# Patient Record
Sex: Female | Born: 1985 | Race: Asian | Hispanic: No | Marital: Single | Smoking: Never smoker
Health system: Southern US, Community
[De-identification: ages and names within clinical notes are randomized; demographics above are authoritative.]

## PROBLEM LIST (undated history)

## (undated) ENCOUNTER — Inpatient Hospital Stay (HOSPITAL_COMMUNITY): Payer: Self-pay

## (undated) ENCOUNTER — Ambulatory Visit: Payer: BLUE CROSS/BLUE SHIELD | Source: Home / Self Care

## (undated) DIAGNOSIS — Z789 Other specified health status: Secondary | ICD-10-CM

## (undated) HISTORY — PX: NO PAST SURGERIES: SHX2092

---
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
## 2005-11-18 ENCOUNTER — Inpatient Hospital Stay (HOSPITAL_COMMUNITY): Admission: AD | Admit: 2005-11-18 | Discharge: 2005-11-18 | Payer: Self-pay | Admitting: Obstetrics and Gynecology

## 2005-12-29 ENCOUNTER — Ambulatory Visit (HOSPITAL_COMMUNITY): Admission: RE | Admit: 2005-12-29 | Discharge: 2005-12-29 | Payer: Self-pay | Admitting: Family Medicine

## 2006-01-29 ENCOUNTER — Ambulatory Visit: Payer: Self-pay | Admitting: Family Medicine

## 2006-02-26 ENCOUNTER — Ambulatory Visit: Payer: Self-pay | Admitting: Family Medicine

## 2006-03-19 ENCOUNTER — Ambulatory Visit: Payer: Self-pay | Admitting: Family Medicine

## 2006-04-02 ENCOUNTER — Ambulatory Visit: Payer: Self-pay | Admitting: Family Medicine

## 2006-04-16 ENCOUNTER — Ambulatory Visit: Payer: Self-pay | Admitting: Obstetrics & Gynecology

## 2006-04-16 ENCOUNTER — Ambulatory Visit (HOSPITAL_COMMUNITY): Admission: RE | Admit: 2006-04-16 | Discharge: 2006-04-16 | Payer: Self-pay | Admitting: Obstetrics and Gynecology

## 2006-04-23 ENCOUNTER — Ambulatory Visit: Payer: Self-pay | Admitting: Family Medicine

## 2006-04-30 ENCOUNTER — Ambulatory Visit: Payer: Self-pay | Admitting: Family Medicine

## 2006-05-03 ENCOUNTER — Inpatient Hospital Stay (HOSPITAL_COMMUNITY): Admission: AD | Admit: 2006-05-03 | Discharge: 2006-05-05 | Payer: Self-pay | Admitting: Obstetrics & Gynecology

## 2006-05-03 ENCOUNTER — Ambulatory Visit: Payer: Self-pay | Admitting: *Deleted

## 2006-06-11 ENCOUNTER — Ambulatory Visit: Payer: Self-pay | Admitting: Sports Medicine

## 2006-06-15 ENCOUNTER — Ambulatory Visit: Payer: Self-pay | Admitting: Family Medicine

## 2006-06-15 LAB — CONVERTED CEMR LAB
Glucose, Urine, Semiquant: NEGATIVE
Nitrite: NEGATIVE
Urobilinogen, UA: 0.2
WBC Urine, dipstick: NEGATIVE

## 2006-07-03 ENCOUNTER — Telehealth (INDEPENDENT_AMBULATORY_CARE_PROVIDER_SITE_OTHER): Payer: Self-pay | Admitting: Family Medicine

## 2006-07-07 ENCOUNTER — Encounter (INDEPENDENT_AMBULATORY_CARE_PROVIDER_SITE_OTHER): Payer: Self-pay | Admitting: Family Medicine

## 2006-07-17 ENCOUNTER — Encounter (INDEPENDENT_AMBULATORY_CARE_PROVIDER_SITE_OTHER): Payer: Self-pay | Admitting: Family Medicine

## 2006-07-22 ENCOUNTER — Ambulatory Visit: Payer: Self-pay | Admitting: Family Medicine

## 2006-07-22 LAB — CONVERTED CEMR LAB: Beta hcg, urine, semiquantitative: NEGATIVE

## 2006-08-05 ENCOUNTER — Ambulatory Visit: Payer: Self-pay | Admitting: Sports Medicine

## 2006-10-21 ENCOUNTER — Ambulatory Visit: Payer: Self-pay | Admitting: Family Medicine

## 2006-12-23 ENCOUNTER — Ambulatory Visit: Payer: Self-pay | Admitting: Family Medicine

## 2007-01-07 ENCOUNTER — Ambulatory Visit: Payer: Self-pay | Admitting: Family Medicine

## 2007-02-08 ENCOUNTER — Encounter: Payer: Self-pay | Admitting: Family Medicine

## 2007-02-08 ENCOUNTER — Ambulatory Visit: Payer: Self-pay | Admitting: Sports Medicine

## 2007-02-08 ENCOUNTER — Telehealth (INDEPENDENT_AMBULATORY_CARE_PROVIDER_SITE_OTHER): Payer: Self-pay | Admitting: *Deleted

## 2007-02-08 LAB — CONVERTED CEMR LAB
Hemoglobin: 12.8 g/dL (ref 12.0–15.0)
MCHC: 33.3 g/dL (ref 30.0–36.0)
RBC: 4.84 M/uL (ref 3.87–5.11)
RDW: 13.9 % (ref 11.5–14.0)
WBC: 7.7 10*3/uL (ref 4.0–10.5)

## 2007-03-31 ENCOUNTER — Ambulatory Visit: Payer: Self-pay | Admitting: Family Medicine

## 2007-06-16 ENCOUNTER — Ambulatory Visit: Payer: Self-pay | Admitting: Family Medicine

## 2007-10-25 ENCOUNTER — Ambulatory Visit (HOSPITAL_COMMUNITY): Admission: RE | Admit: 2007-10-25 | Discharge: 2007-10-25 | Payer: Self-pay | Admitting: Neurology

## 2008-02-03 ENCOUNTER — Encounter (INDEPENDENT_AMBULATORY_CARE_PROVIDER_SITE_OTHER): Payer: Self-pay | Admitting: Family Medicine

## 2008-02-03 ENCOUNTER — Ambulatory Visit: Payer: Self-pay | Admitting: Family Medicine

## 2008-02-03 LAB — CONVERTED CEMR LAB
Bilirubin Urine: NEGATIVE
Blood in Urine, dipstick: NEGATIVE
Ketones, urine, test strip: NEGATIVE
Protein, U semiquant: NEGATIVE
Specific Gravity, Urine: 1.025
Urobilinogen, UA: 0.2
Whiff Test: NEGATIVE
pH: 6

## 2008-02-04 ENCOUNTER — Telehealth: Payer: Self-pay | Admitting: *Deleted

## 2008-02-15 ENCOUNTER — Encounter: Admission: RE | Admit: 2008-02-15 | Discharge: 2008-02-15 | Payer: Self-pay | Admitting: Internal Medicine

## 2009-04-03 ENCOUNTER — Emergency Department (HOSPITAL_COMMUNITY): Admission: EM | Admit: 2009-04-03 | Discharge: 2009-04-03 | Payer: Self-pay | Admitting: Emergency Medicine

## 2009-09-18 ENCOUNTER — Ambulatory Visit: Payer: Self-pay | Admitting: Family Medicine

## 2009-09-18 ENCOUNTER — Encounter: Payer: Self-pay | Admitting: Family Medicine

## 2009-09-18 LAB — CONVERTED CEMR LAB: Beta hcg, urine, semiquantitative: POSITIVE

## 2009-11-19 ENCOUNTER — Ambulatory Visit: Payer: Self-pay | Admitting: Family Medicine

## 2009-11-19 ENCOUNTER — Encounter: Payer: Self-pay | Admitting: Family Medicine

## 2009-11-19 LAB — CONVERTED CEMR LAB
Antibody Screen: NEGATIVE
Basophils Absolute: 0.1 10*3/uL (ref 0.0–0.1)
HCT: 33.1 % — ABNORMAL LOW (ref 36.0–46.0)
Hepatitis B Surface Ag: NEGATIVE
MCV: 81.1 fL (ref 78.0–100.0)
Monocytes Absolute: 0.6 10*3/uL (ref 0.1–1.0)

## 2009-11-26 ENCOUNTER — Ambulatory Visit: Payer: Self-pay | Admitting: Family Medicine

## 2009-11-26 ENCOUNTER — Other Ambulatory Visit: Admission: RE | Admit: 2009-11-26 | Discharge: 2009-11-26 | Payer: Self-pay | Admitting: *Deleted

## 2009-11-26 ENCOUNTER — Encounter: Payer: Self-pay | Admitting: Family Medicine

## 2009-11-26 LAB — CONVERTED CEMR LAB
Chlamydia, DNA Probe: NEGATIVE
Pap Smear: NEGATIVE

## 2009-11-26 LAB — HM PAP SMEAR

## 2009-12-10 ENCOUNTER — Encounter: Payer: Self-pay | Admitting: Family Medicine

## 2009-12-10 ENCOUNTER — Ambulatory Visit (HOSPITAL_COMMUNITY): Admission: RE | Admit: 2009-12-10 | Discharge: 2009-12-10 | Payer: Self-pay | Admitting: Family Medicine

## 2010-01-10 ENCOUNTER — Ambulatory Visit: Payer: Self-pay | Admitting: Family Medicine

## 2010-01-30 ENCOUNTER — Ambulatory Visit: Payer: Self-pay | Admitting: Family Medicine

## 2010-02-04 ENCOUNTER — Encounter: Payer: Self-pay | Admitting: Family Medicine

## 2010-02-04 ENCOUNTER — Ambulatory Visit: Payer: Self-pay | Admitting: Family Medicine

## 2010-02-04 LAB — CONVERTED CEMR LAB
Glucose, 2 hour: 96 mg/dL (ref 70–139)
Glucose, Fasting: 71 mg/dL (ref 70–99)

## 2010-02-06 ENCOUNTER — Ambulatory Visit (HOSPITAL_COMMUNITY): Admission: RE | Admit: 2010-02-06 | Discharge: 2010-02-06 | Payer: Self-pay | Admitting: Family Medicine

## 2010-02-06 ENCOUNTER — Encounter: Payer: Self-pay | Admitting: Family Medicine

## 2010-02-13 ENCOUNTER — Ambulatory Visit: Payer: Self-pay | Admitting: Family Medicine

## 2010-02-13 ENCOUNTER — Encounter: Payer: Self-pay | Admitting: Family Medicine

## 2010-02-13 LAB — CONVERTED CEMR LAB
HIV: NONREACTIVE
MCHC: 33 g/dL (ref 30.0–36.0)
MCV: 85.5 fL (ref 78.0–100.0)
Platelets: 276 10*3/uL (ref 150–400)
RBC: 4 M/uL (ref 3.87–5.11)
RDW: 13.6 % (ref 11.5–15.5)
WBC: 11.4 10*3/uL — ABNORMAL HIGH (ref 4.0–10.5)

## 2010-02-20 ENCOUNTER — Encounter: Payer: Self-pay | Admitting: Family Medicine

## 2010-03-05 ENCOUNTER — Ambulatory Visit: Payer: Self-pay | Admitting: Family Medicine

## 2010-03-19 ENCOUNTER — Ambulatory Visit: Payer: Self-pay | Admitting: Family Medicine

## 2010-04-02 ENCOUNTER — Ambulatory Visit: Payer: Self-pay | Admitting: Family Medicine

## 2010-04-17 ENCOUNTER — Ambulatory Visit: Admission: RE | Admit: 2010-04-17 | Discharge: 2010-04-17 | Payer: Self-pay | Source: Home / Self Care

## 2010-04-17 ENCOUNTER — Telehealth: Payer: Self-pay | Admitting: Family Medicine

## 2010-04-17 ENCOUNTER — Encounter: Payer: Self-pay | Admitting: Family Medicine

## 2010-04-17 ENCOUNTER — Telehealth: Payer: Self-pay | Admitting: *Deleted

## 2010-04-17 LAB — CONVERTED CEMR LAB
Chlamydia, DNA Probe: NEGATIVE
GC Probe Amp, Genital: NEGATIVE
Whiff Test: NEGATIVE

## 2010-04-18 ENCOUNTER — Encounter: Payer: Self-pay | Admitting: Family Medicine

## 2010-04-21 ENCOUNTER — Inpatient Hospital Stay (HOSPITAL_COMMUNITY)
Admission: AD | Admit: 2010-04-21 | Discharge: 2010-04-21 | Payer: Self-pay | Source: Home / Self Care | Attending: Obstetrics and Gynecology | Admitting: Obstetrics and Gynecology

## 2010-04-22 ENCOUNTER — Encounter: Payer: Self-pay | Admitting: Family Medicine

## 2010-04-29 ENCOUNTER — Ambulatory Visit: Admission: RE | Admit: 2010-04-29 | Discharge: 2010-04-29 | Payer: Self-pay | Source: Home / Self Care

## 2010-04-30 ENCOUNTER — Inpatient Hospital Stay (HOSPITAL_COMMUNITY)
Admission: AD | Admit: 2010-04-30 | Discharge: 2010-05-02 | Disposition: A | Discharge status: Still In Hospital | Payer: Self-pay | Source: Home / Self Care | Attending: Obstetrics & Gynecology | Admitting: Obstetrics & Gynecology

## 2010-05-01 LAB — CBC
HCT: 33.7 % — ABNORMAL LOW (ref 36.0–46.0)
Hemoglobin: 11.2 g/dL — ABNORMAL LOW (ref 12.0–15.0)
MCH: 26.7 pg (ref 26.0–34.0)
MCHC: 33.2 g/dL (ref 30.0–36.0)
MCV: 80.4 fL (ref 78.0–100.0)
Platelets: 301 10*3/uL (ref 150–400)
RBC: 4.19 MIL/uL (ref 3.87–5.11)
RDW: 14.5 % (ref 11.5–15.5)
WBC: 14.9 10*3/uL — ABNORMAL HIGH (ref 4.0–10.5)

## 2010-05-01 LAB — RPR: RPR Ser Ql: NONREACTIVE

## 2010-05-07 ENCOUNTER — Ambulatory Visit: Admit: 2010-05-07 | Payer: Self-pay

## 2010-05-14 NOTE — Letter (Signed)
Summary: Handout Printed  Printed Handout:  - Prenatal-Flowsheet-CCC 

## 2010-05-14 NOTE — Assessment & Plan Note (Signed)
Summary: ob,df   Vital Signs:  Patient profile:   25 year old female Weight:      111.8 pounds BP sitting:   96 / 65  Vitals Entered By: Danielle Weiss CMA, (January 10, 2010 3:12 PM)  Primary Care Danielle Weiss:  Danielle Graham, MD   History of Present Illness: OB F/U 23.1 weeks.  Doing well today. See flowsheet. Has [place] at [facility] that confirmed [facility] 05/08/09 (US showed 05/13/09 but within range).  Having a boy and have not decided on name yet.   Only issue is dryness on right upper eyelid.  Very mild thinks due to allergies. Has not tried anything yet.  No fevers, chills.  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 1 By Mouth Daily  Allergies (verified): No Known Drug Allergies  Past History:  Past Medical History: Last updated: 11/26/2009 None  Family History: Last updated: 11/26/2009 No diseases in family that she knows about  Social History: Last updated: 11/26/2009 Lives with Husband and Child. Works at [employer] part time.   Risk Factors: Smoking Status: never (01/10/2010) Packs/Day: n/a (01/10/2010)  Social History: Reviewed history from 11/26/2009 and no changes required. Lives with Husband and Child. Works at [employer] part time.   Review of Systems       See flowsheet otherwise neg  Physical Exam  General:  VS noted.  Well NAD Eyes:  Right upper eyelid with scant skin dryness. Otherwise eye exam WNL BL   Impression & Recommendations:  Problem # 1:  PREGNANCY (ICD-V22.2) Assessment Unchanged Doing well. Good weight gain and uterus growth. Plan f/u 3-4 weeks for glucola and 26 week labs.  Flu shot today. Orders: Flu Vaccine 70yrs + (16109) Medicaid OB visit - [facility]  (60454)  Problem # 2:  DERMATITIS, SEBORRHEIC (ICD-690.10) Assessment: New Advised vaseline on eyelid two times a day. Will follow up at next visit.  Complete Medication List: 1)  Prenatal Vitamins 0.8 Mg Tabs (Prenatal multivit-min-fe-fa) .Marland Kitchen.. 1 by mouth daily  Patient Instructions: 1)  Thank you for seeing me today. 2)  If you have vaginal bleeding, suspect your water has broken (large gush of fluid or continuous slow trickle from vagina) or you have more than 5 contractions per hour for 1 hours straight, please go directly to the MAU. 3)  Every morning and every evening evaluate whether you think the baby is moving enough.  If not, then do kick counts as follows.  Sit in a quiet place and count each movement.  If you get to 5 baby movements in an hour you can stop.  If at 1 hour you have less than 5 movements, keep counting for another hour.  If you don't get 10 movements in these 2 hours, you should go to the MAU or call the clinic.   OB Initial Intake Information    Positive HCG by: self    Race: Oriental/Asian    Marital status: Single    Occupation: outside work    Type of work: Chief Strategy Officer    Number of children at home: 1    [facility] delivery[facility]    Newborn's physician: [facility]  FOB Information    Husband/Father of baby: Danielle Weiss  FOB occupation Works  Menstrual History    LMP (date): 08/01/2009    LMP - Character: normal    On BCP's at conception: no    Date of positive (+) home preg. test: 09/12/2009   Flowsheet View for Follow-up Visit    Estimated weeks of       gestation:     23 1/7    Weight:     111.8    Blood pressure:   96 / 65    Headache:     few    Nausea/vomiting:   No    Edema:     0    Vaginal bleeding:   no    Vaginal discharge:   no    Fetal activity:     yes    Labor symptoms:   no    Fetal position:     ??    Taking prenatal vits?   Y    Smoking:     n/a    Next visit:     4 weeks    Resident:     Danielle Weiss   Appended Document:  ob,df     Allergies: No Known Drug Allergies   Complete Medication List: 1)  Prenatal Vitamins 0.8 Mg Tabs (Prenatal multivit-min-fe-fa) .Marland Kitchen.. 1 by mouth daily  Other Orders: Influenza Vaccine NON MCR (40981)    OB Initial Intake Information    Positive HCG by: self    Race: Oriental/Asian    Marital status: Single    Occupation: outside work    Type of work: Chief Strategy Officer    Number of children at home: 1    [facility] delivery[facility]    Newborn's physician: [facility]  FOB Information    Husband/Father of baby: Danielle Weiss    FOB occupation Works  Menstrual History    LMP (date): 08/01/2009    LMP - Character: normal    On BCP's at conception: no    Date of positive (+) home preg. test: 09/12/2009    Influenza Vaccine    Vaccine Type: Fluvax Non-MCR    Site: left deltoid    Mfr: GlaxoSmithKline    Dose: 0.5 ml    Route: IM    Given by: Danielle Weiss CMA,    Exp. Date: 10/09/2010    Lot #: [id number]    VIS given: 11/06/09 version given January 10, 2010.  Flu Vaccine Consent Questions    Do you have a history of severe allergic reactions to this vaccine? no    Any prior history of allergic reactions to egg and/or gelatin? no    Do you have a sensitivity to the preservative Thimersol? no    Do you have a past history of Guillan-Barre Syndrome? no    Do you currently have an acute febrile illness? no    Have you ever had a severe reaction to latex? no    Vaccine information given and explained to patient? yes    Are you currently pregnant? yes

## 2010-05-14 NOTE — Assessment & Plan Note (Signed)
Summary: pregnancy test & see doctor,tcb   Vital Signs:  Patient profile:   25 year old female Height:      57.5 inches Weight:      96 pounds BMI:     20.49 Temp:     99.0 degrees F oral Pulse rate:   89 / minute BP sitting:   105 / 72  (left arm) Cuff size:   regular  Vitals Entered By: Tessie Fass CMA (September 18, 2009 11:19 AM) CC: pregnancy test Is Patient Diabetic? No Pain Assessment Patient in pain? no        CC:  pregnancy test.  History of Present Illness: [name] is here because she is pregnant.  She took a positive pregnancy test at home 1 week ago.  Her test here is also positive.  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  She and her partner were trying to get pregnant.  Her LMP was 08/04/09 and she is sure of this date.  This gives her and EDC of 05/11/10 and makes her 6 weeks and 3 days pregnant.  She has not experienced any morning sickness yet but her breasts have been tender.  She had ONE SPOT of blood on her underwear on Sunday and again on Monday.  Nothing heavier.  No pain.  This is her second pregnancy.  She has a 67 yo daughter named Danielle Weiss (same father as this baby) who comes here for her medical care.  No complications with her last pregnancy.  Her daughter was born at 17 weeks.  She has no complaints today.  She needs a letter stating nshe is pregnant for medicaid.  She received medicaid with her first pregnancy.   Habits & Providers  Alcohol-Tobacco-Diet     Tobacco Status: never  Physical Exam  General:  thin, alert, NAD, vitals reviewed.  Psych:  Oriented X3, memory intact for recent and remote, normally interactive, good eye contact, not anxious appearing, and not depressed appearing.     Impression & Recommendations:  Problem # 1:  PREGNANCY (ICD-V22.2) Assessment New  Gave letter for medicaid.  She will schedule appt for labwork and first visit after medicaid approved.  If for some reason not approved, we will refer her to adopt a mom.   Previous pregnancy was uncomplicated.  Not worried about current 2 spots of bleeding.  Discussed what to watch for to seek immediate attention - heavier bleeding (more than just a few spots), abdominal pain, fever.  Would like a female provider.   Orders: [facility]- Est Level  3 (16109)  Other Orders: U Preg-[facility] (60454)  Patient Instructions: 1)  Go to the medicaid office to apply for medicaid. 2)  Once you have been approved, call back to schedule your appointment with [place].  I will let them know to schedule you with a female doctor.  3)  If you have more than just a spot or two of blood, if you get a fever, or if you get severe abdominal pain, please call us or go to [facility] for evaluation.   Laboratory Results   Urine Tests  Date/Time Received: September 18, 2009 11:27 AM  Date/Time Reported: September 18, 2009 11:29 AM     Urine HCG: positive Comments: ...............test performed by......Marland KitchenBonnie A. [place], MLS (ASCP)cm

## 2010-05-14 NOTE — Assessment & Plan Note (Signed)
Summary: ob visit/eo   Vital Signs:  Patient profile:   25 year old female Height:      57.5 inches Weight:      119 pounds Pulse rate:   97 / minute BP sitting:   101 / 72  (right arm) Cuff size:   regular  Vitals Entered By: Tessie Fass CMA (February 13, 2010 2:34 PM) CC: OB visit   Primary Care Shauni Henner:  [name], MD  CC:  OB visit.  History of Present Illness: Doing well at 28 weeks.  Obtained ultrasound which showed a normal fetus at 50%.  It estimated the fetus as 1 week younger than did Ms Neela's LMP. She thinks is reliable so will continue with her LMP as the EDC.  Is happy and healthy. See flow sheet.   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 1 By Mouth Daily  Allergies (verified): No Known Drug Allergies  Past History:  Past Medical History: Last updated: 11/26/2009 None  Social History: Last updated: 11/26/2009 Lives with Husband and Child. Works at [employer] part time.   Risk Factors: Smoking Status: never (02/13/2010) Packs/Day: n/a (02/13/2010)  Review of Systems       see flowsheet  Physical Exam  General:  VS noted.  Well woman in NAD Abdomen:  Bowel sounds positive,abdomen soft and non-tender without masses, organomegaly or hernias noted. Uterus 27 cm   Impression & Recommendations:  Problem # 1:  PREGNANCY (ICD-V22.2) Doing well. Expectant management. Follow up in 2 weeks.  Red flags reviewed.  Orders: CBC-[facility] (16109) HIV-[facility] ([id number]) RPR-[facility] [phone])  Complete Medication List: 1)  Prenatal Vitamins 0.8 Mg Tabs (Prenatal multivit-min-fe-fa) .Marland Kitchen.. 1 by mouth daily   Orders Added: 1)  CBC-[facility] [85027] 2)  HIV-[facility]  [[id number]] 3)  RPR-[facility] [[id number]]     OB Initial Intake Information    Positive HCG by: self    Race: Oriental/Asian    Marital status: Single    Occupation: outside work    Type of work: Chief Strategy Officer    Number of children at home: 1    [facility] delivery[facility]    Newborn's physician: [facility]  FOB Information    Husband/Father of baby: Bih Bih    FOB occupation Works  Menstrual History    LMP (date): 08/01/2009    LMP - Character: normal    On BCP's at conception: no    Date of positive (+) home preg. test: 09/12/2009   Flowsheet View for Follow-up Visit    Estimated weeks of       gestation:     28 0/7    Weight:     119    Blood pressure:   101 / 72    Headache:     few    Nausea/vomiting:   No    Edema:     0    Vaginal bleeding:   no    Vaginal discharge:   no    Fundal height:      27    Fetal activity:     yes    Labor symptoms:   no    Taking prenatal vits?   Y    Smoking:  n/a    Next visit:     2 weeks    Resident:     Denyse Amass   Appended Document: ob visit/eo    Clinical Lists Changes  Orders: Added new Test order of Medicaid OB visit - [facility] [phone]) - Signed

## 2010-05-14 NOTE — Letter (Signed)
Summary: Handout Printed  Printed Handout:  - Prenatal-Record-[facility] 

## 2010-05-14 NOTE — Assessment & Plan Note (Signed)
Summary: OB/KH   Vital Signs:  Patient profile:   25 year old female Weight:      124 pounds Pulse rate:   93 / minute BP sitting:   115 / 74  Vitals Entered By: Jone Baseman CMA (March 05, 2010 1:35 PM)  Primary Care Provider:  [name], MD  CC:  OB visit.  History of Present Illness: Well at 30 weeks and 6 days.  No questions. See flowsheet.   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 1 By Mouth Daily  Allergies (verified): No Known Drug Allergies  Past History:  Past Medical History: Last updated: 11/26/2009 None  Family History: Last updated: 11/26/2009 No diseases in family that she knows about  Social History: Last updated: 11/26/2009 Lives with Husband and Child. Works at [employer] part time.   Risk Factors: Smoking Status: never (03/05/2010) Packs/Day: n/a (03/05/2010)  Social History: Reviewed history from 11/26/2009 and no changes required. Lives with Husband and Child. Works at [employer] part time.   Review of Systems  The patient denies anorexia, fever, weight loss, headaches, abdominal pain, and severe indigestion/heartburn.    Physical Exam  General:  VS noted  well NAD Lungs:  Normal respiratory effort, chest expands symmetrically. Lungs are clear to auscultation, no crackles or wheezes. Heart:  Normal rate and regular rhythm. S1 and S2 normal without gallop, murmur, click, rub or other extra sounds. Abdomen:  Bowel sounds positive,abdomen soft and non-tender without masses, organomegaly or hernias noted. Uterus 29 cm Extremities:  Non edemetus Bl LE   Impression & Recommendations:  Problem # 1:  PREGNANCY (ICD-V22.2) Assessment  Unchanged  Doing well at 30.6 weeks.  Plan to follow up in 2 weeks.  Plan for baby to [facility] to [facility], Breast feed, and thinking about depo vs implanon vs mirena will discuss at next visit.   Orders: Medicaid OB visit - [facility] (16109)  Complete Medication List: 1)  Prenatal Vitamins 0.8 Mg Tabs (Prenatal multivit-min-fe-fa) .Marland Kitchen.. 1 by mouth daily  Patient Instructions: 1)  Thank you for seeing me today. 2)  If you have vaginal bleeding, suspect your water has broken (large gush of fluid or continuous slow trickle from vagina) or you have more than 5 contractions per hour for 2 hours straight, please go directly to the MAU. 3)  Every morning and every evening evaluate whether you think the baby is moving enough.  If not, then do kick counts as follows.  Sit in a quiet place and count each movement.  If you get to 5 baby movements in an hour you can stop.  If at 1 hour you have less than 5 movements, keep counting for another hour.  If you don't get 10 movements in these 2 hours, you should go to the MAU or call the clinic. 4)  Please schedule a follow-up appointment in 2 weeks.    Orders Added: 1)  Medicaid OB visit - [facility] [[id number]]     Flowsheet View for Follow-up Visit    Estimated weeks of       gestation:     30 6/7    Weight:     124    Blood pressure:   115 /  74    Headache:     few    Nausea/vomiting:   No    Edema:     0    Vaginal bleeding:   no    Vaginal discharge:   no    Fundal height:      29    FHR:       145    Fetal activity:     yes    Labor symptoms:   no    Fetal position:     vertex    Taking prenatal vits?   Y    Smoking:     n/a    Next visit:     2 weeks    Resident:     Denyse Amass

## 2010-05-14 NOTE — Letter (Signed)
Summary: Generic Letter  [facility]  [address]   [place], [place] [address]   Phone: [phone]  Fax: [phone]    09/18/2009  [facility] [address] [place], [place]  [address]  To Whom it May Concern,  Ms. Loise was seen in our office on June 7th, 2011.  She has a positive pregnancy test.  Her LMP was April 23rd, 20ll.  Her approx due date is January 28th, 2012.  This makes her 6 weeks and 3 days pregnant. Please call our office if you have any questions.          Sincerely,   Ardeen Garland  MD

## 2010-05-14 NOTE — Assessment & Plan Note (Signed)
Summary: ob visit/eo   Vital Signs:  Patient profile:   25 year old female Height:      57.5 inches Weight:      116.6 pounds BMI:     24.88 Temp:     98.2 degrees F oral Pulse rate:   86 / minute BP sitting:   100 / 68  (left arm) Cuff size:   regular  Vitals Entered By: Jimmy Footman, CMA (January 30, 2010 9:42 AM) CC: OB  26    0/7 Is Patient Diabetic? No   Primary Care Provider:  [name], MD  CC:  OB  26    0/7.  History of Present Illness: Doing well at 26 weeks.  No subjective issues. Feels baby move.  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 1 By Mouth Daily  Allergies (verified): No Known Drug Allergies  Past History:  Past Medical History: Last updated: 11/26/2009 None  Family History: Last updated: 11/26/2009 No diseases in family that she knows about  Social History: Last updated: 11/26/2009 Lives with Husband and Child. Works at [employer] part time.   Risk Factors: Smoking Status: never (01/30/2010) Packs/Day: n/a (01/30/2010)  Review of Systems  The patient denies anorexia, fever, and weight loss.    Physical Exam  General:  VS noted.  Well NAD Abdomen:  Bowel sounds positive,abdomen soft and non-tender without masses, organomegaly or hernias noted. Uterus 21 cm   Impression & Recommendations:  Problem # 1:  PREGNANCY (ICD-V22.2) Size < dates.  21 cm at 26 weeks.  Last baby 6lbs at birth. Mom and Dad both short. Plan eval for IUGR via Ob ultrasound at [facility].  Follow up in 2 weeks.  Otherwise doing well.  Orders: Ultrasound (Ultrasound) Glucose 1 hr-[facility] (82950) Medicaid OB visit - [facility] (16109)  Complete Medication List: 1)  Prenatal Vitamins  0.8 Mg Tabs (Prenatal multivit-min-fe-fa) .Marland Kitchen.. 1 by mouth daily  Patient Instructions: 1)  Thank you for seeing me today. 2)  Please schedule a follow-up appointment in 2 weeks.  3)  Go for your ultrasound as discussed.    Orders Added: 1)  Ultrasound [Ultrasound] 2)  Glucose 1 hr-[facility] [82950] 3)  Medicaid OB visit - [facility] [[id number]]     Flowsheet View for Follow-up Visit    Estimated weeks of       gestation:     26 0/7    Weight:     116.6    Blood pressure:   100 / 68    Headache:     few    Nausea/vomiting:   No    Edema:     0    Vaginal bleeding:   no    Vaginal discharge:   no    Fundal height:      21    Fetal activity:     yes    Labor symptoms:   no    Fetal position:     ??    Taking prenatal vits?   Y    Smoking:     n/a    Next visit:     2 weeks    Resident:     Denyse Amass  OB Initial Intake Information  Positive HCG by: self    Race: Oriental/Asian    Marital status: Single    Occupation: outside work    Type of work: Chief Strategy Officer    Number of children at home: 1    [facility]    Newborn's physician: [facility]  FOB Information    Husband/Father of baby: Bih Bih    FOB occupation Works  Menstrual History    LMP (date): 08/01/2009    LMP - Character: normal    On BCP's at conception: no    Date of positive (+) home preg. test: 09/12/2009

## 2010-05-14 NOTE — Assessment & Plan Note (Signed)
Summary: NOB/DSL   Vital Signs:  Patient profile:   25 year old female LMP:     08/01/2009 Weight:      103 pounds BP sitting:   105 / 70  Vitals Entered By: Tessie Fass CMA (November 26, 2009 8:49 AM) CC: NEW OB LMP (date): 08/01/2009 EDC by LMP==> 05/08/2010 EDC 05/08/2010 LMP - Character: normal LMP - Reliable? Yes     On BCP's at conception: no Date of + home preg. test: 09/12/2009 Enter LMP: 08/01/2009   Primary Care Timiko Offutt:  . [facility]  CC:  NEW OB.  History of Present Illness: Ms Klover presents to clinic today for a New Ob visit at 16weeks and 5 days. She is feeling well. Please see flow sheet.  When she was diagnosed with pregnancy in June she had requested a female Farrel Guimond. She will be OK if a man delivers her baby but she would perfer a female if possible.   Feels well no issues.  Habits & Providers  Alcohol-Tobacco-Diet     Alcohol drinks/day: 0     Tobacco Status: never     Cigarette Packs/Day: n/a  Current Problems (verified): 1)  Pregnancy  (ICD-V22.2)  Current Medications (verified): 1)  Prenatal Vitamins 0.8 Mg Tabs (Prenatal Multivit-Min-Fe-Fa) .Marland Kitchen.. 1 By Mouth Daily  Allergies (verified): No Known Drug Allergies  Past History:  Family History: Last updated: 11/26/2009 No diseases in family that she knows about  Social History: Last updated: 11/26/2009 Lives with Husband and Child. Works at [employer] part time.   Risk Factors: Smoking Status: never (11/26/2009) Packs/Day: n/a (11/26/2009)  Past Medical History: None  Family History: No diseases in family that she knows about  Social History: Lives with Husband and Child. Works at [employer] part time.  Occupation:  [employer] Hepatitis Risk:  no Packs/Day:  n/a  Review of Systems       See HPI and flowsheet  Physical Exam  General:  VS noted. Well woman in NAD Rectal:  No external abnormalities noted.  Genitalia:  Normal introitus for age, no external lesions,  no vaginal discharge, mucosa pink and moist, no vaginal or cervical lesions, no vaginal atrophy, no friaility or hemorrhage, normal uterus size  for dates (16 cm) and position, no adnexal masses or tenderness   Impression & Recommendations:  Problem # 1:  PREGNANCY (ICD-V22.2) Well no issues. Requests female Percy Winterrowd. Will refer to [facility].  Discusses Quad Screen Patient declined.  Ultrasound at end of Aug for anat scan.  Follow up with Dr. Ellin Mayhew in 1 month.   Orders: Ultrasound (Ultrasound) GC/Chlamydia-[facility] (87591/87491) Pap Smear-[facility] ([id number]) Medicaid OB visit - [facility] ([id number])  Complete Medication List: 1)  Prenatal Vitamins 0.8 Mg Tabs (Prenatal multivit-min-fe-fa) .Marland Kitchen.. 1 by mouth daily  Patient Instructions: 1)  Thank you for seeing me today. 2)  Please schedule an appointment with Dr. Basilia Jumbo in [facility] September (10th - 20th). 3)  Molly Maduro is scheduling your ultrasound. 4)  If you have vaginal bleeding, suspect your water has broken (large gush of fluid or continuous slow trickle from vagina) or you have more than 5 contractions per hour for 1 hours straight, please go directly to the MAU. 5)  Please schedule a follow-up appointment in 1 month.  Prescriptions: PRENATAL VITAMINS 0.8 MG TABS (PRENATAL MULTIVIT-MIN-FE-FA) 1 by mouth daily  #30 x 12   Entered and Authorized by:   [name] MD   Signed by:   [name] MD on 11/26/2009  Method used:   Print then Give to Patient   RxID:   [id number]    OB Initial Intake Information    Positive HCG by: self    Race: Oriental/Asian    Marital status: Single    Occupation: outside work    Type of work: Chief Strategy Officer    Number of children at home: 1    [facility]    Newborn's physician: [facility]  FOB Information    Husband/Father of baby: Bih Bih    FOB occupation Works  Menstrual History    LMP (date): 08/01/2009    EDC by LMP: 05/08/2010    Best Working EDC: 05/08/2010    LMP -  Character: normal    LMP - Reliable? : Yes    On BCP's at conception: no    Date of positive (+) home preg. test: 09/12/2009    Pre Pregnancy Weight: 96 lbs.    Symptoms since LMP: amenorrhea, fatigue, irritability   Flowsheet View for Follow-up Visit    Estimated weeks of       gestation:     16 5/7    Weight:     103    Blood pressure:   105 / 70    Headache:     few    Nausea/vomiting:   No    Edema:     0    Vaginal bleeding:   no    Vaginal discharge:   no    Fundal height:      16    FHR:       154    Fetal activity:     yes    Labor symptoms:   no    Fetal position:     ??    Cx Dilation:     0    Cx Effacement:   0%    Cx Station:     high    Taking prenatal vits?   Y    Smoking:     n/a    Next visit:     4 weeks    Resident:     Denyse Amass    Preceptor:     Mauricio Po   Past Pregnancy History    Gravida:     2    Term Births:     1    Living Children:   1    Para:       1  Pregnancy # 1    Delivery date:     05/03/2008    Weeks Gestation:   38    Delivery type:     NSVD    Anesthesia type:     epidural    Delivery location:     [facility]    Infant Sex:     Female    Birth weight:     6 4oz    Name:     Jolynn  Pregnancy # 2    Comments:     Current   Genetic History    Father of baby:   Bih Bih     Thalassemia:     mother: no   father: no    Neural tube defect:   mother: no   father: no    Down's Syndrome:   mother: no   father: no    Tay-Sachs:     mother: no   father: no    Sickle Cell Dz/Trait:   mother: no   father:  no    Hemophilia:     mother: no   father: no    Muscular Dystrophy:   mother: no   father: no    Cystic Fibrosis:   mother: no   father: no    Huntington's Dz:   mother: no   father: no    Mental Retardation:   mother: no   father: no    Fragile X:     mother: no   father: no    Other Genetic or       Chromosomal Dz:   mother: no   father: no    Child with other       birth defect:     mother: no   father: no    > 3 spont. abortions:    mother: no    Hx of stillbirth:     mother: no  Infection Risk History    Infection Risk History Reviewed:    High Risk Hepatitis B: no    Immunized against Hepatitis B: no    Exposure to TB: no    Patient with history of Genital Herpes: no    Sexual partner with history of Genital Herpes: no    History of STD (GC, Chlamydia, Syphilis, HPV): no    Rash, Viral, or Febrile Illness since LMP: no    Exposure to Cat Litter: no    Chicken Pox Immune Status: Hx of Disease: Immune    History of Parvovirus (Fifth Disease): no    Occupational Exposure to Children: none  Environmental Exposures    Xray Exposure since LMP: no    Chemical or other exposure: no    Medication, drug, or alcohol use since LMP: no

## 2010-05-14 NOTE — Assessment & Plan Note (Signed)
Summary: ob visit/eo   Vital Signs:  Patient profile:   25 year old female Weight:      125.6 pounds Pulse rate:   98 / minute BP sitting:   103 / 69  Vitals Entered By: Garen Grams LPN (March 19, 2010 10:57 AM)  Primary Care Daphna Lafuente:  [name], MD  CC:  Ob .  History of Present Illness: Well today at 32 weeks.  No issues. Happy and health.  Baby will go to [facility] Will breast and bottle feed.  Thinking about Deopt, Mirena or Implinon for contraception.   Habits & Providers  Alcohol-Tobacco-Diet     Tobacco Status: never     Cigarette Packs/Day: n/a  Current Problems (verified): 1)  Dermatitis, Seborrheic  (ICD-690.10) 2)  Pregnancy  (ICD-V22.2)  Current Medications (verified): 1)  Prenatal Vitamins 0.8 Mg Tabs (Prenatal Multivit-Min-Fe-Fa) .Marland Kitchen.. 1 By Mouth Daily  Allergies (verified): No Known Drug Allergies  Past History:  Past Medical History: Last updated: 11/26/2009 None  Family History: Last updated: 11/26/2009 No diseases in family that she knows about  Social History: Last updated: 11/26/2009 Lives with Husband and Child. Works at [employer] part time.   Review of Systems  The patient denies anorexia, fever, and weight loss.    Physical Exam  General:  VS noted  well NAD Abdomen:  Bowel sounds positive,abdomen soft and non-tender without masses, organomegaly or hernias noted. Uterus 29 cm Extremities:  Non edemetus Bl LE   Impression & Recommendations:  Problem # 1:  PREGNANCY (ICD-V22.2) Assessment Unchanged  Doing well.  Plan for routine follow up in 2 weeks.  Labor and kick count precations reviewed.   Orders: Medicaid OB visit - [facility] ([id number])  Complete Medication List: 1)  Prenatal Vitamins 0.8 Mg Tabs (Prenatal multivit-min-fe-fa) .Marland Kitchen.. 1 by mouth daily  Patient Instructions: 1)  Thank you for seeing me today. 2)  If you have vaginal bleeding, suspect your water has broken (large gush of fluid or continuous slow trickle  from vagina) or you have more than 5 contractions per hour for 1 hours straight, please go directly to the MAU. 3)  Every morning and every evening evaluate whether you think the baby is moving enough.  If not, then do kick counts as follows.  Sit in a quiet place and count each movement.  If you get to 5 baby movements in an hour you can stop.  If at 1 hour you have less than 5 movements, keep counting for another hour.  If you don't get 10 movements in these 2 hours, you should go to the MAU or call the clinic. 4)  Please schedule a follow-up appointment in 2 weeks.    Orders Added: 1)  Medicaid OB visit - [facility] [[id number]]     OB Initial Intake Information    Positive HCG by: self    Race: Oriental/Asian    Marital status: Single    Occupation: outside work    Type of work: Chief Strategy Officer    Number of children at home: 1    [facility] delivery[facility]    Newborn's physician: [facility]  FOB Information    Husband/Father of baby: Bih Bih    FOB occupation Works  Menstrual History    LMP (date): 08/01/2009    LMP - Character: normal    On BCP's at conception: no    Date of positive (+) home preg. test: 09/12/2009   Flowsheet View for Follow-up Visit    Estimated  weeks of       gestation:     32 6/7    Weight:     125.6    Blood pressure:   103 / 69    Headache:     few    Nausea/vomiting:   No    Edema:     0    Vaginal bleeding:   no    Vaginal discharge:   no    Fundal height:      29    FHR:       140    Fetal activity:     yes    Labor symptoms:   no    Fetal position:     vertex    Taking prenatal vits?   Y    Smoking:     n/a    Next visit:     2 weeks    Resident:     Denyse Amass

## 2010-05-16 NOTE — Assessment & Plan Note (Signed)
Summary: 1 WK F/U PER MD/RH   Vital Signs:  Patient profile:   25 year old female Height:      57.5 inches Weight:      134 pounds Pulse rate:   81 / minute BP sitting:   112 / 76  (right arm) Cuff size:   regular  Vitals Entered By: Tessie Fass CMA (April 29, 2010 2:56 PM) CC: OB F/U Pain Assessment Patient in pain? no        Primary Care Provider:  [name], MD  CC:  OB F/U.  History of Present Illness: Doing well at 38.5. Went to MAU last Sunday (8days ago) for painful contractions lasting 4 hours. She had exam and monitoring showing that she was not in labor.  Today she feels well and is not having labor signs or symptoms.   Habits & Providers  Alcohol-Tobacco-Diet     Cigarette Packs/Day: n/a  Current Problems (verified): 1)  Pregnancy  (ICD-V22.2)  Current Medications (verified): 1)  Prenatal Vitamins 0.8 Mg Tabs (Prenatal Multivit-Min-Fe-Fa) .... 1 By Mouth Daily  Allergies (verified): No Known Drug Allergies  Past History:  Past Medical History: Last updated: 11/26/2009 None  Family History: Last updated: 11/26/2009 No diseases in family that she knows about  Social History: Last updated: 11/26/2009 Lives with Husband and Child. Works at nail salon part time.   Risk Factors: Smoking Status: never (04/17/2010) Packs/Day: n/a (04/29/2010)  Review of Systems  The patient denies anorexia, fever, and weight loss.    Physical Exam  General:  VS noted.  Well NAD Abdomen:   Uterus 34 cm Extremities:  Non edemetus Bl LE   Impression & Recommendations:  Problem # 1:  PREGNANCY (ICD-V22.2) Assessment Unchanged  Doing great today.  No issues. Measuring less than dates but this has been present througout pregnancy and baby was OK on repeat US.  Plan to follow up in 1 week.  Will start scheduling induction for 41 weeks then.   Orders: Medicaid OB visit - [facility] ([id number])  Complete Medication List: 1)  Prenatal Vitamins 0.8 Mg Tabs  (Prenatal multivit-min-fe-fa) .... 1 by mouth daily   Orders Added: 1)  Medicaid OB visit - [facility] [[id number]]     Flowsheet View for Follow-up Visit    Estimated weeks of       gestation:     38 5/7    Weight:     134    Blood pressure:   112 / 76    Headache:     No    Nausea/vomiting:   No    Edema:     0    Vaginal bleeding:   no    Vaginal discharge:   d/c    Fundal height:      34    FHR:       14 2    Fetal activity:     yes    Labor symptoms:   no    Fetal position:     vertex    Taking prenatal vits?   Y    Smoking:     n/a    Next visit:     1 wk    Resident:     Denyse Amass

## 2010-05-16 NOTE — Progress Notes (Signed)
  Phone Note Outgoing Call   Summary of Call: Called cell phone number. Left a message advising pt to take an OTC yeast infection medication. She will call back with questions.  Initial call taken by: [name] MD,  April 17, 2010 10:43 AM

## 2010-05-16 NOTE — Assessment & Plan Note (Signed)
Summary: 2 WKS OB F/U PER MD/RH   Vital Signs:  Patient profile:   25 year old female Weight:      130.38 pounds BP sitting:   112 / 76  (right arm) CC: 37wk OB check Is Patient Diabetic? No Pain Assessment Patient in pain? no        Primary Care Provider:  [name], MD  CC:  37wk OB check.  History of Present Illness: [name] presents to clinic today at 37 weeks and 0 days for a well woman pregnancy visit. She feels well and has no issues. She feels the baby move and denies any bleeding. She notes a whiteish discharge but denies any pain.   She is happy and healthy and looking forward to her baby.   Habits & Providers  Alcohol-Tobacco-Diet     Tobacco Status: never     Cigarette Packs/Day: n/a  Current Problems (verified): 1)  Vaginal Discharge  (ICD-623.5) 2)  Pregnancy  (ICD-V22.2)  Current Medications (verified): 1)  Prenatal Vitamins 0.8 Mg Tabs (Prenatal Multivit-Min-Fe-Fa) .Marland Kitchen.. 1 By Mouth Daily  Allergies (verified): No Known Drug Allergies  Past History:  Past Medical History: Last updated: 11/26/2009 None  Family History: Last updated: 11/26/2009 No diseases in family that she knows about  Social History: Last updated: 11/26/2009 Lives with Husband and Child. Works at [employer] part time.   Review of Systems  The patient denies anorexia, fever, and weight loss.    Physical Exam  General:  VS noted.  Well NAD Abdomen:   Uterus 34 cm Genitalia:  Normal introitus for age, no external lesions, white vaginal discharge, mucosa pink and moist, no vaginal or cervical lesions, no vaginal atrophy, no friaility or hemorrhage, normal uterus size  for dates (34 cm) and position, no adnexal masses or tenderness Extremities:  Non edemetus Bl LE   Impression & Recommendations:  Problem # 1:  PREGNANCY (ICD-V22.2) Assessment Unchanged Doing well. Checked GC/CHL and GBS today as per normal 36 week labs. Reviewed labor precautions and kick counts.    Will follow up in 1 week.  Orders: GC/Chlamydia-[facility] ([id number]) Grp B Probe-[facility] ([id number]) Medicaid OB visit - [facility] ([id number])  Problem # 2:  VAGINAL DISCHARGE (ICD-623.5) Assessment: New Awaiting Wet Prep. If positive will call in Flagyl.  Pt's cell phone number is [phone] [facility] [phone][facility]   Update: Wet Prep showed yeast infection. Advised pt to take OTC yeast infection treatment.   Complete Medication List: 1)  Prenatal Vitamins 0.8 Mg Tabs (Prenatal multivit-min-fe-fa) .Marland Kitchen.. 1 by mouth daily  Patient Instructions: 1)  Thank you for seeing me today. 2)  If you have vaginal bleeding, suspect your water has broken (large gush of fluid or continuous slow trickle from vagina) or you have more than 5 contractions per hour for 2 hours straight, please go directly to the MAU. 3)  Every morning and every evening evaluate whether you think the baby is moving enough.  If not, then do kick counts as follows.  Sit in a quiet place and count each movement.  If you get to 5 baby movements in an hour you can stop.  If at 1 hour you have less than 5 movements, keep counting for another hour.  If you don't get 10 movements in these 2 hours, you should go to the MAU or call the clinic. 4)  Please schedule a follow-up appointment in 1 weeks.    Orders Added: 1)  GC/Chlamydia-[facility] [[id number]] 2)  Grp  B Probe-[facility] [[id number]] 3)  Wet Prep- [facility] [[id number]] 4)  Medicaid OB visit - [facility] [[id number]]      Flowsheet View for Follow-up Visit    Estimated weeks of       gestation:     37 0/7    Weight:     130.38    Blood pressure:   112 / 76    Headache:     No    Nausea/vomiting:   No    Edema:     0    Vaginal bleeding:   no    Vaginal discharge:   d/c    Fundal height:      34    FHR:       135    Fetal activity:     yes    Labor symptoms:   no    Fetal position:     vertex    Cx Dilation:     0    Cx Effacement:   0%    Cx Station:     high    Taking prenatal vits?   Y     Smoking:     n/a    Next visit:     1 wk    Resident:     Denyse Amass   Laboratory Results  Date/Time Received: April 17, 2010 8:55 AM  Date/Time Reported: April 17, 2010 10:16 AM   Wet [place] Source: vag WBC/hpf: >20 Bacteria/hpf: 2+  Rods Clue cells/hpf: none  Negative whiff Yeast/hpf: many Trichomonas/hpf: none Comments: ...............test performed by......Marland KitchenBonnie A. [place], MLS (ASCP)cm

## 2010-05-16 NOTE — Assessment & Plan Note (Signed)
Summary: ob visit/eo   Vital Signs:  Patient profile:   25 year old female Height:      57.5 inches Weight:      127.9 pounds Pulse rate:   92 / minute BP sitting:   110 / 76  (right arm) Cuff size:   regular  Vitals Entered By: Tessie Fass CMA (April 02, 2010 1:37 PM) CC: OB Visit   Primary Care Provider:  [name], MD  CC:  OB Visit.  History of Present Illness: Doing well at 34.6 weeks.  Only notes mild shortness of breath. Felt a bit like this at the last pregnancy. No severe SOB with mild exertion. No swelling noted.  Feels well otherwise.   Habits & Providers  Alcohol-Tobacco-Diet     Alcohol drinks/day: 0     Tobacco Status: never     Cigarette Packs/Day: n/a  Current Problems (verified): 1)  Pregnancy  (ICD-V22.2)  Current Medications (verified): 1)  Prenatal Vitamins 0.8 Mg Tabs (Prenatal Multivit-Min-Fe-Fa) .Marland Kitchen.. 1 By Mouth Daily  Allergies (verified): No Known Drug Allergies  Past History:  Past Medical History: Last updated: 11/26/2009 None  Family History: Last updated: 11/26/2009 No diseases in family that she knows about  Social History: Last updated: 11/26/2009 Lives with Husband and Child. Works at [employer] part time.   Risk Factors: Smoking Status: never (04/02/2010) Packs/Day: n/a (04/02/2010)  Review of Systems  The patient denies anorexia, fever, and weight loss.    Physical Exam  General:  VS noted.  Well NAD Lungs:  Normal respiratory effort, chest expands symmetrically. Lungs are clear to auscultation, no crackles or wheezes. Heart:  Normal rate and regular rhythm. S1 and S2 normal without gallop, murmur, click, rub or other extra sounds. Abdomen:   Uterus 30 cm Extremities:  Non edemetus Bl LE   Impression & Recommendations:  Problem # 1:  PREGNANCY (ICD-V22.2) Assessment Unchanged  Doing well. No sign of heart failure or other worysem sign or symptom.  Think mild SOB is late stage pregnancy related.    Will follow up in 1.5 weeks or so at 36 weeks for repeat GC/CHL and GBS test.  Red flags, as well as labor and kickcount precautions reviewed.  Mom happy.  Orders: Medicaid OB visit - [facility] ([id number])  Complete Medication List: 1)  Prenatal Vitamins 0.8 Mg Tabs (Prenatal multivit-min-fe-fa) .Marland Kitchen.. 1 by mouth daily  Patient Instructions: 1)  Thank you for seeing me today. 2)  If your shortness of breath gets worse let me know. 3)  If you have chest pain, difficulty breathing, fevers over 102 that does not get better with tylenol please call us or see a doctor.  4)  If you have vaginal bleeding, suspect your water has broken (large gush of fluid or continuous slow trickle from vagina) or you have more than 5 contractions per hour for 2 hours straight, please go directly to the MAU. 5)  Every morning and every evening evaluate whether you think the baby is moving enough.  If not, then do kick counts as follows.  Sit in a quiet place and count each movement.  If you get to 5 baby movements in an hour you can stop.  If at 1 hour you have less than 5 movements, keep counting for another hour.  If you don't get 10 movements in these 2 hours, you should go to the MAU or call the clinic. 6)  Please schedule a follow-up appointment in 1-2 weeks.    Orders  Added: 1)  Medicaid OB visit - [facility] [[id number]]     OB Initial Intake Information    Positive HCG by: self    Race: Oriental/Asian    Marital status: Single    Occupation: outside work    Type of work: Chief Strategy Officer    Number of children at home: 1    [facility] delivery[facility]    Newborn's physician: [facility]  FOB Information    Husband/Father of baby: Bih Bih    FOB occupation Works  Menstrual History    LMP (date): 08/01/2009    LMP - Character: normal    On BCP's at conception: no    Date of positive (+) home preg. test: 09/12/2009   Flowsheet View for Follow-up Visit    Estimated weeks of       gestation:     34 6/7    Weight:     127.9     Blood pressure:   110 / 76    Headache:     No    Nausea/vomiting:   No    Edema:     0    Vaginal bleeding:   no    Vaginal discharge:   no    Fundal height:      30    FHR:       140    Fetal activity:     yes    Labor symptoms:   no    Fetal position:     vertex    Taking prenatal vits?   Y    Smoking:     n/a    Next visit:     2 weeks    Resident:     Denyse Amass

## 2010-05-16 NOTE — Progress Notes (Signed)
Summary: Triage  Phone Note Call from Patient Call back at [phone]   Reason for Call: Talk to Nurse Summary of Call: pt has questions re: what she can take for yeast infection, see last phn msg Initial call taken by: Knox Royalty,  April 17, 2010 11:12 AM  Follow-up for Phone Call        Advised to purchase Monistat. Follow-up by: Dennison Nancy RN,  April 17, 2010 11:26 AM

## 2010-05-16 NOTE — Letter (Signed)
Summary: Handout Printed  Printed Handout:  - Prenatal-Record-[facility] 

## 2010-05-16 NOTE — Letter (Signed)
Summary: Handout Printed  Printed Handout:  - Prenatal-Flowsheet-CCC 

## 2010-06-04 ENCOUNTER — Encounter: Payer: Self-pay | Admitting: Family Medicine

## 2010-06-04 ENCOUNTER — Ambulatory Visit (INDEPENDENT_AMBULATORY_CARE_PROVIDER_SITE_OTHER): Payer: Medicaid Other | Admitting: Family Medicine

## 2010-06-04 DIAGNOSIS — Z309 Encounter for contraceptive management, unspecified: Secondary | ICD-10-CM

## 2010-06-04 DIAGNOSIS — Z975 Presence of (intrauterine) contraceptive device: Secondary | ICD-10-CM

## 2010-06-04 NOTE — Assessment & Plan Note (Signed)
Mirena iud inserted today.  Did well. Plan to f/u in 1 month for string check.

## 2010-06-04 NOTE — Assessment & Plan Note (Signed)
Doing well at 6 weeks. Anatomy was normal. OK to have sex. Gave precautions. Will f/u in 1 month per IUD

## 2010-06-04 NOTE — Progress Notes (Signed)
Doing well at around 6 weeks post partum. Feels well. No issues. Has not had a period or sex yet. No abdominal or pelvic pain. Happy and healthy.   ROS: No fevers, chills.   Exam: VS noted.  Gen: Well NAD Heart: RRR no MRG Lungs: CTABL ABD: Nabs, nt nd, fundus not palpable.  Genitalia: Normal external Cervix is normal appearing. Uterus is retro-flexed.   IUD insert note: Consent obtained and time out performed. Cervix position was checked with a bimanual exam and found to be retrofelexed.  A speculum was placed and the cervix was visualized. The cervix was cleaned with betadine. A tenaculum was attached to the cervix on the anterior lip.  A sounding probed was easily placed and reached 6cm before resistance was met. Then the Mirena device was prepared with the flange set to 6cm.  The device was inserted and the IUD was released without difficulty. The strings were cut to 2-3 cm. The patient tolerated the procedure well without complications or more than mild pain and cramping. Pooling of blood in vault stopped with fox swab.

## 2010-06-04 NOTE — Patient Instructions (Signed)
Thank you for coming in today. Let me know if the bleeding gets worse or does not stop. It should be like a normal period.  If you have pain after tonight let me know.  Come back in 4 weeks for a recheck.  Call me about fevers or chills.

## 2010-06-05 MED ORDER — LEVONORGESTREL 20 MCG/24HR IU IUD
INTRAUTERINE_SYSTEM | Freq: Once | INTRAUTERINE | Status: AC
  Administered 2010-06-04: 1 via INTRAUTERINE

## 2010-06-05 NOTE — Progress Notes (Signed)
I have placed the orders for this patient.  Thank you

## 2010-06-26 LAB — GLUCOSE, CAPILLARY
Glucose-Capillary: 178 mg/dL — ABNORMAL HIGH (ref 70–99)
Glucose-Capillary: 80 mg/dL (ref 70–99)

## 2010-07-15 LAB — URINALYSIS, ROUTINE W REFLEX MICROSCOPIC
Bilirubin Urine: NEGATIVE
Ketones, ur: NEGATIVE mg/dL
Nitrite: NEGATIVE
Protein, ur: NEGATIVE mg/dL
Urobilinogen, UA: 0.2 mg/dL (ref 0.0–1.0)

## 2010-07-15 LAB — URINE CULTURE: Colony Count: 2000

## 2010-07-15 LAB — URINE MICROSCOPIC-ADD ON

## 2010-07-17 ENCOUNTER — Ambulatory Visit (INDEPENDENT_AMBULATORY_CARE_PROVIDER_SITE_OTHER): Payer: Medicaid Other | Admitting: Family Medicine

## 2010-07-17 ENCOUNTER — Encounter: Payer: Self-pay | Admitting: Family Medicine

## 2010-07-17 VITALS — BP 108/75 | HR 90 | Temp 98.4°F | Wt 109.0 lb

## 2010-07-17 DIAGNOSIS — Z975 Presence of (intrauterine) contraceptive device: Secondary | ICD-10-CM

## 2010-07-17 NOTE — Progress Notes (Signed)
Danielle Weiss presents to clinic today to follow up her IUD. It was placed around 1 month ago.  During placement she had bleeding that stopped. She feels well. No pain minimal cramping. No bleeding. Feels well and healthy.   ROS: No fevers or chills.   Gen: Well NAD GYN: Cervical os is normal. IUD strings are present. Non tender during exam.

## 2010-07-17 NOTE — Assessment & Plan Note (Signed)
Doing well. IUD in place. Asymptomatic.  No changes.  Plan to f/u in 1 year or sooner if needed. Will see pt with her young infant if something come up.

## 2015-07-07 ENCOUNTER — Encounter (HOSPITAL_COMMUNITY): Payer: Self-pay | Admitting: Emergency Medicine

## 2015-07-07 ENCOUNTER — Emergency Department (INDEPENDENT_AMBULATORY_CARE_PROVIDER_SITE_OTHER)
Admission: EM | Admit: 2015-07-07 | Discharge: 2015-07-07 | Disposition: A | Discharge status: Home / Self Care | Payer: BLUE CROSS/BLUE SHIELD | Source: Home / Self Care | Attending: Family Medicine | Admitting: Family Medicine

## 2015-07-07 DIAGNOSIS — R21 Rash and other nonspecific skin eruption: Secondary | ICD-10-CM

## 2015-07-07 DIAGNOSIS — L509 Urticaria, unspecified: Secondary | ICD-10-CM

## 2015-07-07 MED ORDER — CETIRIZINE HCL 10 MG PO CAPS: ORAL_CAPSULE | ORAL | Status: DC

## 2015-07-07 NOTE — Discharge Instructions (Signed)
Pht ban (Hives) Pht ban l nh?ng vng da b? ng?a, t?y ??, s?ng. Nh?ng vng da ny c th? khc nhau v? kch th??c v v? tr trn c? th?. Pht ban c th? xu?t hi?n v h?t trong vi gi? ho?c vi ngy (pht ban c?p tnh) ho?c trong vi tu?n (pht ban m?n tnh). Pht ban khng ly t? ng??i sang ng??i (khng ly lan). Cc vng pht ban c th? tr?m tr?ng h?n n?u gi, t?p th? d?c v b? c?ng th?ng tinh th?n. NGUYN NHN   Ph?n ?ng d? ?ng v?i th?c ?n, ch?t ph? gia ho?c thu?c.  Nhi?m trng, bao g?m c? c?m l?nh thng th??ng.  B?nh t?t, ch?ng h?n nh? vim m?ch, lupus ho?c b?nh tuy?n gip.  Ti?p xc v?i nh n?ng m?t tr?i, nng ho?c l?nh.  T?p luy?n.  C?ng th?ng.  Ti?p xc v?i ha ch?t. TRI?U CH?NG   Cc ??m ?? ho?c tr?ng s?ng n? trn da. Cc ??m ny c th? thay ??i kch th??c, hnh d?ng v v? tr nhanh chng v lin t?c.  Ng?a.  S?ng bn tay, bn chn v m?t. ?i?u ny c th? x?y ra n?u pht ban pht tri?n su h?n trong da. CH?N ?ON  Chuyn gia ch?m [place] s?c kh?e th??ng c th? xc ??nh ? l v?n ?? g b?ng vi?c khm th?c th?Vanessa Henry. Xt nghi?m da ho?c mu c?ng c th? ???c th?c hi?n ?? xc ??nh nguyn nhn pht ban. Trong m?t s? tr??ng h?p, nguyn nhn khng xc ??nh ???c. ?I?U TR?  Nh?ng tr??ng h?p nh? th??ng ?? h?n khi dng thu?c nh? thu?c khng histamine. Tr??ng h?p n?ng c th? c?n ph?i tim epinephrine kh?n c?p. N?u nguyn nhn pht ban ???c xc ??nh, ?i?u tr? bao g?m c? vi?c trnh tc nhn ?.  H??NG D?N CH?M [place] T?I NH   Trnh cc nguyn nhn gy pht ban.  S? d?ng thu?c khng histamin theo ch? d?n c?a chuyn gia ch?m [place] s?c kh?e ?? gi?m m?c ?? n?ng c?a pht ban. Thu?c khng histamin khng gy ng? ho?c c tc d?ng gy ng? nh? th??ng ???c khuyn dng. Khng li xe trong khi dng thu?c khng histamin.  S? d?ng b?t c? lo?i thu?c ? k ??n no khc ?? ?i?u tr? ng?a theo ch? d?n c?a chuyn gia ch?m [place] s?c kh?e.  M?c qu?n o r?ng.  Tun th? m?i cu?c h?n khm l?i theo ch? d?n c?a chuyn gia ch?m  [place] s?c kh?e. ?I KHM N?U:   Qu v? b? ng?a dai d?ng ho?c r?t nhi?u m khng ?? sau khi dng thu?c.  Cc kh?p c?a qu v? b? ?au ho?c s?ng. NGAY L?P T?C ?I KHM N?U:   Qu v? b? s?t.  L??i ho?c mi c?a qu v? b? s?ng.  Qu v? b? kh th? ho?c kh nu?t.  Qu v? c?m th?y th?t ngh?t trong c? h?ng ho?c trong ng?c.  Qu v? b? ?au b?ng. Nh?ng v?n ?? ny c th? l d?u hi?u ??u tin c?a ph?n ?ng d? ?ng ?e d?a ??n tnh m?ng. G?i cho d?ch v? c?p c?u t?i ??a ph??ng (911 ? [place] ??M B?O QU V?:   Hi?u r cc h??ng d?n ny.  S? theo di tnh tr?ng c?a mnh.  S? yu c?u tr? gip ngay l?p t?c n?u qu v? c?m th?y khng kh?e ho?c th?y tr?m tr?ng h?n.   Thng tin ny khng nh?m m?c ?ch thay th? cho l?i khuyn m chuyn gia ch?m [place] s?c kh?e ni v?i  ngay l?p t?c n?u quý v? c?m th?y không kh?e ho?c th?y tr?m tr?ng h?n.     Thông tin này không nh?m m?c ?ích thay th? cho l?i khuyên mà chuyên gia ch?m sóc s?c kh?e nói v?i quý v?. Hãy b?o ??m quý v? ph?i th?o lu?n b?t k? v?n ?? gì mà quý v? có v?i chuyên gia ch?m sóc s?c kh?e c?a quý v?.     Document Released: 03/31/2005 Document Revised: 04/05/2013  Elsevier Interactive Patient Education ©2016 [employer] Inc[employer]

## 2015-07-07 NOTE — ED Notes (Signed)
C/o rash all over body onset x3 months  Denies fevers Reports PCP gave her cetirizine but has run out A&O x4... No acute distress.

## 2015-07-07 NOTE — ED Provider Notes (Signed)
CSN: [id number]     Arrival date & time 07/07/15  1954 History   First MD Initiated Contact with Patient 07/07/15 2113     Chief Complaint  Patient presents with  . Rash   (Consider location/radiation/quality/duration/timing/severity/associated sxs/prior Treatment) HPI Comments: Female 30 years old presents with 3 months of itching and rash. She has been to her PCP and was prescribed Claritin which caused some side effects and does not have. She was not prescribed cetirizine which helps with the rash. It is a red macular type rash that comes and goes in various areas. Currently it is on her forearms and 2 small areas of the face. Denies problems with breathing, cough, swelling in the mouth. She is uncertain as to etiology. She has an appointment with dermatology next week. She is requesting a refill of her cetirizine   History reviewed. No pertinent past medical history. History reviewed. No pertinent past surgical history. No family history on file. Social History  Substance Use Topics  . Smoking status: Never Smoker   . Smokeless tobacco: None  . Alcohol Use: No   OB History    No data available     Review of Systems  Constitutional: Negative.   HENT: Negative.   Skin: Positive for rash.  All other systems reviewed and are negative.   Allergies  Review of patient's allergies indicates no known allergies.  Home Medications   Prior to Admission medications   Medication Sig Start Date End Date Taking? Authorizing Provider  Cetirizine HCl 10 MG CAPS Take 10 mg tablet once daily for rash and itching 07/07/15   Hayden Rasmussenavid Jaymarion Trombly, NP  Prenatal Multivit-Min-Fe-FA 0.8 MG TABS Take 1 tablet by mouth daily.      Historical Provider, MD   Meds Ordered and Administered this Visit  Medications - No data to display  BP 113/79 mmHg  Pulse 71  Temp(Src) 97.4 F (36.3 C) (Oral)  Resp 16  SpO2 100% No data found.   Physical Exam  Constitutional: She is oriented to person, place, and  time. She appears well-developed and well-nourished. No distress.  HENT:  No intraoral edema or erythema. No swelling. Airway widely patent.  Eyes: EOM are normal.  Neck: Normal range of motion. Neck supple.  Cardiovascular: Normal rate.   Pulmonary/Chest: Effort normal and breath sounds normal. No respiratory distress.  Musculoskeletal: She exhibits no edema.  Neurological: She is alert and oriented to person, place, and time. She exhibits normal muscle tone.  Skin: Skin is warm and dry. Rash noted.  Red cutaneous pruritic rash to the volar aspect of both forearms, lower back and small areas to the face, just above the upper lip. No apparent involvement of the eyes.  Psychiatric: She has a normal mood and affect.  Nursing note and vitals reviewed.   ED Course  Procedures (including critical care time)  Labs Review Labs Reviewed - No data to display  Imaging Review No results found.   Visual Acuity Review  Right Eye Distance:   Left Eye Distance:   Bilateral Distance:    Right Eye Near:   Left Eye Near:    Bilateral Near:         MDM   1. Rash   2. Urticaria    Zyrtec 10 mg po q d Rx #30 NR See derm in 1 week, Keep appointment. No steroids prescribed this visit anticipating possible allergy testing at the dermatology office next week.    Hayden Rasmussenavid Desiderio Dolata, NP 07/07/15 2153

## 2015-07-10 ENCOUNTER — Encounter: Payer: Self-pay | Admitting: Allergy and Immunology

## 2015-07-10 ENCOUNTER — Ambulatory Visit (INDEPENDENT_AMBULATORY_CARE_PROVIDER_SITE_OTHER): Payer: BLUE CROSS/BLUE SHIELD | Admitting: Allergy and Immunology

## 2015-07-10 VITALS — BP 100/75 | HR 85 | Temp 97.5°F | Resp 16 | Ht <= 58 in | Wt 102.5 lb

## 2015-07-10 DIAGNOSIS — L500 Allergic urticaria: Secondary | ICD-10-CM | POA: Diagnosis not present

## 2015-07-10 DIAGNOSIS — Z91018 Allergy to other foods: Secondary | ICD-10-CM

## 2015-07-10 DIAGNOSIS — T783XXA Angioneurotic edema, initial encounter: Secondary | ICD-10-CM

## 2015-07-10 DIAGNOSIS — T7800XA Anaphylactic reaction due to unspecified food, initial encounter: Secondary | ICD-10-CM | POA: Diagnosis not present

## 2015-07-10 HISTORY — DX: Allergy to other foods: Z91.018

## 2015-07-10 HISTORY — DX: Angioneurotic edema, initial encounter: T78.3XXA

## 2015-07-10 HISTORY — DX: Allergic urticaria: L50.0

## 2015-07-10 MED ORDER — CETIRIZINE HCL 10 MG PO TABS: 10.0000 mg | ORAL_TABLET | Freq: Two times a day (BID) | ORAL | Status: DC

## 2015-07-10 MED ORDER — RANITIDINE HCL 150 MG PO TABS: 150.0000 mg | ORAL_TABLET | Freq: Two times a day (BID) | ORAL | Status: DC

## 2015-07-10 MED ORDER — EPINEPHRINE 0.3 MG/0.3ML IJ SOAJ: 0.3000 mg | Freq: Once | INTRAMUSCULAR | Status: AC

## 2015-07-10 NOTE — Assessment & Plan Note (Deleted)
Associated angioedema occurs in up to 50% of patients with chronic urticaria.  Treatment/diagnostic plan as outlined above. 

## 2015-07-10 NOTE — Assessment & Plan Note (Signed)
Associated angioedema occurs in up to 50% of patients with chronic urticaria.  Treatment/diagnostic plan as outlined above. 

## 2015-07-10 NOTE — Assessment & Plan Note (Addendum)
Possible food allergy to fish and shellfish.  Meticulous avoidance of fish and shellfish as discussed.  A prescription has been provided for epinephrine auto-injector 2 pack along with instructions for proper administration.  A food allergy action plan has been provided and discussed.  Medic Alert identification is recommended.  If urticaria/angioedema persist despite meticulous avoidance of fish and shellfish, we will check serum specific IgE against these foods and, if negative will proceed with open graded oral challenge.

## 2015-07-10 NOTE — Assessment & Plan Note (Addendum)
Food allergy versus chronic urticaria.  Food allergen skin test was borderline positive to fish mix and lobster.  The patient is uncertain if there is a clear correlation between the consumption of these foods and her symptoms.  Therefore, for now she will avoid these foods and assess for symptom resolution.  For now, Danielle Weiss is to meticulously avoid fish and shellfish.  A food allergy action plan and prescription for epinephrine autoinjector 2 pack has been provided.  If the urticaria and angioedema persist despite strict avoidance of fish and shellfish, we will proceed with labs to rule out other potential etiologies, including screening urticaria labs and serum specific IgE against fish and shellfish panels.  Instructions have been provided and discussed for H1/H2 receptor blockade with titration to find lowest effective dose.  Should symptoms recur, a journal is to be kept recording any foods eaten, beverages consumed, medications taken within a 6 hour period prior to the onset of symptoms, as well as record activities being performed, and environmental conditions. For any symptoms concerning for anaphylaxis, epinephrine is to be administered and 911 is to be called immediately.

## 2015-07-10 NOTE — Progress Notes (Signed)
New Patient Note  RE: Danielle Weiss MRN: [id number] DOB: 1985-11-14 Date of Office Visit: 07/10/2015  Referring provider: Joanna Puff, MD Primary care provider: Kaleen Mask, MD  Chief Complaint: Urticaria and Angioedema   History of present illness: HPI Comments: Danielle Weiss is a 30 y.o. female presenting today for consultation of hives. Over the past 4 months, Danielle Weiss has experienced recurrent episodes of hives.  The hives initially appeared while she was on a trip to [place] and progressed after returning to the [place].  Typical distribution includes the entire body.  The lesions are described as erythematous, raised, and pruritic.  Individual hives last less than 24 hours without leaving residual pigmentation or bruising. She usually experiences mild angioedema of the lips or eyelids in association with the hives.  However, she does not experience concomitant cardiopulmonary or GI symptoms. She has not experienced unexpected weight loss, recurrent fevers or drenching night sweats. No specific medication, food or environmental triggers have been identified. The symptoms seem do not seem to correlate with NSAIDs use or emotional stress. She did not have signs or symptoms of infection at the time of symptom onset. Danielle Weiss suppresses the hives with cetirizine 10 mg daily, however if she discontinues this medication for more than 2 days the symptoms recur. She has been evaluated and treated in the emergency department for these symptoms. Skin biopsy has not been performed.    Assessment and plan: Recurrent urticaria Food allergy versus chronic urticaria.  Food allergen skin test was borderline positive to fish mix and lobster.  The patient is uncertain if there is a clear correlation between the consumption of these foods and her symptoms.  Therefore, for now she will avoid these foods and assess for symptom resolution.  For now, Danielle Weiss is to meticulously avoid fish and shellfish.   A food allergy action plan and prescription for epinephrine autoinjector 2 pack has been provided.  If the urticaria and angioedema persist despite strict avoidance of fish and shellfish, we will proceed with labs to rule out other potential etiologies, including screening urticaria labs and serum specific IgE against fish and shellfish panels.  Instructions have been provided and discussed for H1/H2 receptor blockade with titration to find lowest effective dose.  Should symptoms recur, a journal is to be kept recording any foods eaten, beverages consumed, medications taken within a 6 hour period prior to the onset of symptoms, as well as record activities being performed, and environmental conditions. For any symptoms concerning for anaphylaxis, epinephrine is to be administered and 911 is to be called immediately.  Angioedema Associated angioedema occurs in up to 50% of patients with chronic urticaria.  Treatment/diagnostic plan as outlined above.  Food allergy Possible food allergy to fish and shellfish.  Meticulous avoidance of fish and shellfish as discussed.  A prescription has been provided for epinephrine auto-injector 2 pack along with instructions for proper administration.  A food allergy action plan has been provided and discussed.  Medic Alert identification is recommended.  If urticaria/angioedema persist despite meticulous avoidance of fish and shellfish, we will check serum specific IgE against these foods and, if negative will proceed with open graded oral challenge.    Meds ordered this encounter  Medications  . cetirizine (ZYRTEC) 10 MG tablet    Sig: Take 1 tablet (10 mg total) by mouth 2 (two) times daily.    Dispense:  60 tablet    Refill:  5  . ranitidine (ZANTAC) 150 MG tablet  Sig: Take 1 tablet (150 mg total) by mouth 2 (two) times daily.    Dispense:  60 tablet    Refill:  5  . EPINEPHrine (EPIPEN 2-PAK) 0.3 mg/0.3 mL IJ SOAJ injection    Sig:  Inject 0.3 mLs (0.3 mg total) into the muscle once.    Dispense:  1 Device    Refill:  1    Please dispense (1) two pack mylan generic brand.    Diagnositics: Environmental skin testing: Negative despite a positive histamine control. Food allergen skin testing: Borderline positive to fish mix and lobster.    Physical examination: Blood pressure 100/75, pulse 85, temperature 97.5 F (36.4 C), temperature source Oral, resp. rate 16, height 4' 9.87" (1.47 m), weight 102 lb 8.2 oz (46.5 kg).  General: Alert, interactive, in no acute distress. Lungs: Clear to auscultation without wheezing, rhonchi or rales. CV: Normal S1, S2 without murmurs. Abdomen: Nondistended, nontender. Skin: Scattered erythematous urticarial type lesions primarily located on the forearms bilaterally , nonvesicular. Extremities:  No clubbing, cyanosis or edema. Neuro:   Grossly intact.  Review of systems:  Review of Systems  Constitutional: Negative for fever, chills and weight loss.  HENT: Negative for congestion and nosebleeds.   Eyes: Negative for blurred vision.  Respiratory: Negative for cough, hemoptysis, shortness of breath and wheezing.   Cardiovascular: Negative for chest pain.  Gastrointestinal: Negative for nausea, vomiting, diarrhea and constipation.  Genitourinary: Negative for dysuria.  Musculoskeletal: Negative for myalgias and joint pain.  Skin: Positive for itching and rash.  Neurological: Negative for dizziness.  Endo/Heme/Allergies: Negative for environmental allergies. Does not bruise/bleed easily.    Past medical history:  Other than issues mentioned in the history of present illness, no chronic diseases or recent hospitalizations have been reported.   Past surgical history:  Past Surgical History  Procedure Laterality Date  . No past surgeries      Family history: Family History  Problem Relation Age of Onset  . Allergic rhinitis Neg Hx   . Angioedema Neg Hx   . Asthma Neg Hx    . Atopy Neg Hx   . Eczema Neg Hx   . Immunodeficiency Neg Hx   . Urticaria Neg Hx     Social history: Social History   Social History  . Marital Status: Single    Spouse Name: N/A  . Number of Children: N/A  . Years of Education: N/A   Occupational History  . Not on file.   Social History Main Topics  . Smoking status: Never Smoker   . Smokeless tobacco: Not on file  . Alcohol Use: No  . Drug Use: No  . Sexual Activity: Not on file   Other Topics Concern  . Not on file   Social History Narrative   Lives with husband and Child. Works at [employer] part time.         Environmental History: The patient lives in a 67-year-old house with carpeting in the bedroom and central air/heat.  She is a nonsmoker without pets.    Medication List       This list is accurate as of: 07/10/15 12:21 PM.  Always use your most recent med list.               cetirizine 10 MG tablet  Commonly known as:  ZYRTEC  Take 10 mg by mouth at bedtime.     Cetirizine HCl 10 MG Caps  Take 10 mg tablet once daily for rash and  itching     cetirizine 10 MG tablet  Commonly known as:  ZYRTEC  Take 1 tablet (10 mg total) by mouth 2 (two) times daily.     EPINEPHrine 0.3 mg/0.3 mL Soaj injection  Commonly known as:  EPIPEN 2-PAK  Inject 0.3 mLs (0.3 mg total) into the muscle once.     Prenatal Multivit-Min-Fe-FA 0.8 MG Tabs  Take 1 tablet by mouth daily. Reported on 07/10/2015     ranitidine 150 MG tablet  Commonly known as:  ZANTAC  Take 1 tablet (150 mg total) by mouth 2 (two) times daily.        Known medication allergies: No Known Allergies  I appreciate the opportunity to take part in this Danielle Weiss's care. Please do not hesitate to contact me with questions.  Sincerely,   R. Jorene Guestarter Paco Cislo, MD

## 2015-07-10 NOTE — Patient Instructions (Addendum)
Recurrent urticaria Food allergy versus chronic urticaria.  Food allergen skin test was borderline positive to fish mix and lobster.  The patient is uncertain if there is a clear correlation between the consumption of these foods and her symptoms.  Therefore, for now she will avoid these foods and assess for symptom resolution.  For now, Danielle Weiss is to meticulously avoid fish and shellfish.  A food allergy action plan and prescription for epinephrine autoinjector 2 pack has been provided.  If the urticaria and angioedema persist despite strict avoidance of fish and shellfish, we will proceed with labs to rule out other potential etiologies, including screening urticaria labs and serum specific IgE against fish and shellfish panels.  Instructions have been provided and discussed for H1/H2 receptor blockade with titration to find lowest effective dose.  Should symptoms recur, a journal is to be kept recording any foods eaten, beverages consumed, medications taken within a 6 hour period prior to the onset of symptoms, as well as record activities being performed, and environmental conditions. For any symptoms concerning for anaphylaxis, epinephrine is to be administered and 911 is to be called immediately.  Angioedema Associated angioedema occurs in up to 50% of patients with chronic urticaria.  Treatment/diagnostic plan as outlined above.  Food allergy Possible food allergy to fish and shellfish.  Meticulous avoidance of fish and shellfish as discussed.  A prescription has been provided for epinephrine auto-injector 2 pack along with instructions for proper administration.  A food allergy action plan has been provided and discussed.  Medic Alert identification is recommended.  If urticaria/angioedema persist despite meticulous avoidance of fish and shellfish, we will check serum specific IgE against these foods and, if negative will proceed with open graded oral challenge.    Return in  about 4 weeks (around 08/07/2015), or if symptoms worsen or fail to improve.  Hives (urticaria)  . Cetirizine (Zyrtec) 10mg  twice a day and ranitidine (Zantac) 150 mg twice a day. If no symptoms for 7-14 days then decrease to. . Cetirizine (Zyrtec) 10mg  twice a day and ranitidine (Zantac) 150 mg once a day.  If no symptoms for 7-14 days then decrease to. . Cetirizine (Zyrtec) 10mg  twice a day.  If no symptoms for 7-14 days then decrease to. . Cetirizine (Zyrtec) 10mg  once a day.  May use Benadryl (diphenhydramine) as needed for breakthrough hives       If symptoms return, then step up dosage

## 2015-07-16 ENCOUNTER — Encounter: Payer: Self-pay | Admitting: *Deleted

## 2015-08-20 ENCOUNTER — Ambulatory Visit (INDEPENDENT_AMBULATORY_CARE_PROVIDER_SITE_OTHER): Payer: BLUE CROSS/BLUE SHIELD | Admitting: Allergy and Immunology

## 2015-08-20 ENCOUNTER — Encounter: Payer: Self-pay | Admitting: Allergy and Immunology

## 2015-08-20 VITALS — BP 110/64 | HR 72 | Resp 16

## 2015-08-20 DIAGNOSIS — K2970 Gastritis, unspecified, without bleeding: Secondary | ICD-10-CM | POA: Diagnosis not present

## 2015-08-20 DIAGNOSIS — T783XXD Angioneurotic edema, subsequent encounter: Secondary | ICD-10-CM

## 2015-08-20 DIAGNOSIS — L500 Allergic urticaria: Secondary | ICD-10-CM | POA: Diagnosis not present

## 2015-08-20 MED ORDER — MONTELUKAST SODIUM 10 MG PO TABS: 10.0000 mg | ORAL_TABLET | Freq: Every day | ORAL | Status: DC

## 2015-08-20 MED ORDER — LEVOCETIRIZINE DIHYDROCHLORIDE 5 MG PO TABS: 5.0000 mg | ORAL_TABLET | Freq: Every evening | ORAL | Status: DC

## 2015-08-20 NOTE — Assessment & Plan Note (Addendum)
Symptoms have continued despite meticulous avoidance of fish and shellfish.  The following labs have been ordered: FCeRI antibody, TSH, anti-thyroglobulin antibody, thyroid peroxidase antibody, tryptase, urea breath test, CBC, CMP, ESR, ANA, and galactose-alpha-1,3-galactose IgE level.  The patient will be called with further recommendations after lab results have returned.  Instructions have been discussed and provided for modified H1/H2 receptor blockade with titration to find lowest effective dose.  A prescription has been provided for levocetirizine, 5 mg daily as needed.  A prescription has been provided for montelukast 10 mg daily at bedtime. Should significant symptoms recur or new symptoms occur, a journal is to be kept recording any foods eaten, beverages consumed, medications taken, activities performed, and environmental conditions within a 6 hour time period prior to the onset of symptoms. For any symptoms concerning for anaphylaxis, epinephrine is to be administered and 911 is to be called immediately.

## 2015-08-20 NOTE — Patient Instructions (Addendum)
Recurrent urticaria Symptoms have continued despite meticulous avoidance of fish and shellfish.  The following labs have been ordered: FCeRI antibody, TSH, anti-thyroglobulin antibody, thyroid peroxidase antibody, tryptase, urea breath test, CBC, CMP, ESR, ANA, and galactose-alpha-1,3-galactose IgE level.  The patient will be called with further recommendations after lab results have returned.  Instructions have been discussed and provided for modified H1/H2 receptor blockade with titration to find lowest effective dose.  A prescription has been provided for levocetirizine, 5 mg daily as needed.  A prescription has been provided for montelukast 10 mg daily at bedtime. Should significant symptoms recur or new symptoms occur, a journal is to be kept recording any foods eaten, beverages consumed, medications taken, activities performed, and environmental conditions within a 6 hour time period prior to the onset of symptoms. For any symptoms concerning for anaphylaxis, epinephrine is to be administered and 911 is to be called immediately.   Angioedema Urticaria with associated angioedema.    Treatment plan as outlined above.   When lab results have returned the patient will be called with further recommendations and follow up instructions.  Urticaria (Hives)  . Levocetirizine (Xyzal) 5 mg twice a day and ranitidine (Zantac) 150 mg twice a day. If no symptoms for 7-14 days then decrease to. . Levocetirizine (Xyzal) 5 mg twice a day and ranitidine (Zantac) 150 mg once a day.  If no symptoms for 7-14 days then decrease to. . Levocetirizine (Xyzal) 5 mg twice a day.  If no symptoms for 7-14 days then decrease to. . Levocetirizine (Xyzal) 5 mg once a day.  . Take montelukast 10 mg daily at bedtime.  May use Benadryl (diphenhydramine) as needed for breakthrough symptoms       If symptoms return, then step up dosage

## 2015-08-20 NOTE — Progress Notes (Signed)
Follow-up Note  RE: Danielle Weiss MRN: [id number] DOB: 1986-02-06 Date of Office Visit: 08/20/2015  Primary care provider: Leonard Downing, MD Referring provider: Leonard Downing, *  History of present illness: HPI Comments: Danielle Weiss is a 30 y.o. female with recurrent urticaria/angioedema who presents today for follow up.  She is previously seen in this clinic on 07/10/2015.  Hives almost every morning. Taking zyrtec and ranitidine in the morning and cetirizine at night.   xyzal bid/ ranitidine bid Add montelukast  Check Labs      Assessment and plan: Recurrent urticaria Symptoms have continued despite meticulous avoidance of fish and shellfish.  The following labs have been ordered: FCeRI antibody, TSH, anti-thyroglobulin antibody, thyroid peroxidase antibody, tryptase, urea breath test, CBC, CMP, ESR, ANA, and galactose-alpha-1,3-galactose IgE level.  The patient will be called with further recommendations after lab results have returned.  Instructions have been discussed and provided for modified H1/H2 receptor blockade with titration to find lowest effective dose.  A prescription has been provided for levocetirizine, 5 mg daily as needed.  A prescription has been provided for montelukast 10 mg daily at bedtime. Should significant symptoms recur or new symptoms occur, a journal is to be kept recording any foods eaten, beverages consumed, medications taken, activities performed, and environmental conditions within a 6 hour time period prior to the onset of symptoms. For any symptoms concerning for anaphylaxis, epinephrine is to be administered and 911 is to be called immediately.   Angioedema Urticaria with associated angioedema.    Treatment plan as outlined above.    Meds ordered this encounter  Medications  . montelukast (SINGULAIR) 10 MG tablet    Sig: Take 1 tablet (10 mg total) by mouth at bedtime.    Dispense:  30 tablet    Refill:  5  .  levocetirizine (XYZAL) 5 MG tablet    Sig: Take 1 tablet (5 mg total) by mouth every evening.    Dispense:  30 tablet    Refill:  5      Physical examination: Blood pressure 110/64, pulse 72, resp. rate 16.  General: Alert, interactive, in no acute distress. Lungs: Clear to auscultation without wheezing, rhonchi or rales. CV: Normal S1, S2 without murmurs. Skin: Warm and dry, without lesions or rashes.  The following portions of the patient's history were reviewed and updated as appropriate: allergies, current medications, past family history, past medical history, past social history, past surgical history and problem list.    Medication List       This list is accurate as of: 08/20/15  7:53 PM.  Always use your most recent med list.               cetirizine 10 MG tablet  Commonly known as:  ZYRTEC  Take 1 tablet (10 mg total) by mouth 2 (two) times daily.     EPINEPHrine 0.3 mg/0.3 mL Soaj injection  Commonly known as:  EPIPEN 2-PAK  Inject 0.3 mLs (0.3 mg total) into the muscle once.     levocetirizine 5 MG tablet  Commonly known as:  XYZAL  Take 1 tablet (5 mg total) by mouth every evening.     montelukast 10 MG tablet  Commonly known as:  SINGULAIR  Take 1 tablet (10 mg total) by mouth at bedtime.     Prenatal Multivit-Min-Fe-FA 0.8 MG Tabs  Take 1 tablet by mouth daily. Reported on 07/10/2015     ranitidine 150 MG tablet  Commonly known as:  ZANTAC  Take 1 tablet (150 mg total) by mouth 2 (two) times daily.        No Known Allergies  Review of systems: Constitutional: Negative for fever, chills and weight loss.  HENT: Negative for nosebleeds.   Eyes: Negative for blurred vision.  Respiratory: Negative for hemoptysis.   Cardiovascular: Negative for chest pain.  Gastrointestinal: Negative for diarrhea and constipation.  Genitourinary: Negative for dysuria.  Musculoskeletal: Negative for myalgias and joint pain.  Neurological: Negative for dizziness.    Endo/Heme/Allergies: Does not bruise/bleed easily.  Cutaneous: Positive for urticaria and angioedema.  No past medical history on file.  Family History  Problem Relation Age of Onset  . Allergic rhinitis Neg Hx   . Angioedema Neg Hx   . Asthma Neg Hx   . Atopy Neg Hx   . Eczema Neg Hx   . Immunodeficiency Neg Hx   . Urticaria Neg Hx     Social History   Social History  . Marital Status: Single    Spouse Name: N/A  . Number of Children: N/A  . Years of Education: N/A   Occupational History  . Not on file.   Social History Main Topics  . Smoking status: Never Smoker   . Smokeless tobacco: Not on file  . Alcohol Use: No  . Drug Use: No  . Sexual Activity: Not on file   Other Topics Concern  . Not on file   Social History Narrative   Lives with husband and Child. Works at [employer] part time.          I appreciate the opportunity to take part in this [name]'s care. Please do not hesitate to contact me with questions.  Sincerely,   R. Edgar Frisk, MD

## 2015-08-20 NOTE — Assessment & Plan Note (Signed)
Urticaria with associated angioedema.    Treatment plan as outlined above.

## 2015-08-23 LAB — COMPLETE METABOLIC PANEL WITH GFR
ALT: 15 U/L (ref 6–29)
AST: 16 U/L (ref 10–30)
Albumin: 4.4 g/dL (ref 3.6–5.1)
Alkaline Phosphatase: 42 U/L (ref 33–115)
BUN: 14 mg/dL (ref 7–25)
CO2: 21 mmol/L (ref 20–31)
Calcium: 9 mg/dL (ref 8.6–10.2)
Chloride: 105 mmol/L (ref 98–110)
Creat: 0.83 mg/dL (ref 0.50–1.10)
GFR, Est African American: >89 mL/min (ref >=60)
GFR, Est Non African American: >89 mL/min (ref >=60)
Glucose, Bld: 89 mg/dL (ref 65–99)
Potassium: 4 mmol/L (ref 3.5–5.3)
Sodium: 139 mmol/L (ref 135–146)
Total Bilirubin: 0.4 mg/dL (ref 0.2–1.2)
Total Protein: 7.5 g/dL (ref 6.1–8.1)

## 2015-08-23 LAB — CBC WITH DIFFERENTIAL/PLATELET
Basophils Absolute: 0 cells/uL (ref 0–200)
Basophils Relative: 0 %
Eosinophils Absolute: 300 cells/uL (ref 15–500)
Eosinophils Relative: 4 %
HCT: 38.1 % (ref 35.0–45.0)
Hemoglobin: 12.5 g/dL (ref 11.7–15.5)
Lymphocytes Relative: 28 %
Lymphs Abs: 2100 cells/uL (ref 850–3900)
MCH: 27.5 pg (ref 27.0–33.0)
MCHC: 32.8 g/dL (ref 32.0–36.0)
MCV: 83.7 fL (ref 80.0–100.0)
MPV: 10.1 fL (ref 7.5–12.5)
Monocytes Absolute: 525 cells/uL (ref 200–950)
Monocytes Relative: 7 %
Neutro Abs: 4575 cells/uL (ref 1500–7800)
Neutrophils Relative %: 61 %
Platelets: 369 10*3/uL (ref 140–400)
RBC: 4.55 MIL/uL (ref 3.80–5.10)
RDW: 13.8 % (ref 11.0–15.0)
WBC: 7.5 10*3/uL (ref 3.8–10.8)

## 2015-08-23 LAB — SEDIMENTATION RATE: Sed Rate: 10 mm/hr (ref 0–20)

## 2015-08-24 LAB — ANA: Anti Nuclear Antibody(ANA): NEGATIVE

## 2015-08-24 LAB — TRYPTASE: Tryptase: 1.1 ug/L (ref <11)

## 2017-03-01 ENCOUNTER — Emergency Department (HOSPITAL_COMMUNITY)
Admission: EM | Admit: 2017-03-01 | Discharge: 2017-03-01 | Disposition: A | Discharge status: Home / Self Care | Payer: BLUE CROSS/BLUE SHIELD | Attending: Emergency Medicine | Admitting: Emergency Medicine

## 2017-03-01 ENCOUNTER — Other Ambulatory Visit: Payer: Self-pay

## 2017-03-01 ENCOUNTER — Encounter (HOSPITAL_COMMUNITY): Payer: Self-pay | Admitting: Emergency Medicine

## 2017-03-01 DIAGNOSIS — R1012 Left upper quadrant pain: Secondary | ICD-10-CM | POA: Insufficient documentation

## 2017-03-01 DIAGNOSIS — M7918 Myalgia, other site: Secondary | ICD-10-CM | POA: Insufficient documentation

## 2017-03-01 DIAGNOSIS — Z79899 Other long term (current) drug therapy: Secondary | ICD-10-CM | POA: Insufficient documentation

## 2017-03-01 DIAGNOSIS — R1032 Left lower quadrant pain: Secondary | ICD-10-CM | POA: Diagnosis present

## 2017-03-01 MED ORDER — CYCLOBENZAPRINE HCL 10 MG PO TABS
5.0000 mg | ORAL_TABLET | Freq: Once | ORAL | Status: AC
  Administered 2017-03-01: 5 mg via ORAL
  Filled 2017-03-01: qty 1

## 2017-03-01 MED ORDER — NAPROXEN 500 MG PO TABS: 500.0000 mg | ORAL_TABLET | Freq: Two times a day (BID) | ORAL | 0 refills | Status: DC

## 2017-03-01 MED ORDER — CYCLOBENZAPRINE HCL 5 MG PO TABS: 5.0000 mg | ORAL_TABLET | Freq: Three times a day (TID) | ORAL | 0 refills | Status: DC | PRN

## 2017-03-01 MED ORDER — KETOROLAC TROMETHAMINE 30 MG/ML IJ SOLN
30.0000 mg | Freq: Once | INTRAMUSCULAR | Status: AC
  Administered 2017-03-01: 30 mg via INTRAMUSCULAR
  Filled 2017-03-01: qty 1

## 2017-03-01 NOTE — ED Provider Notes (Signed)
[facility]-EMERGENCY DEPT Provider Note   CSN: [id number] Arrival date & time: 03/01/17  0028     History   Chief Complaint Chief Complaint  Patient presents with  . Abdominal Pain    HPI Danielle Weiss is a 31 y.o. female.  HPI  This is a 31 year old female who presents with left abdominal pain.  Patient reports that she was doing exercises with an abnormal lower when she felt a pop and pain in her left abdominal region.  She currently rates her pain at 8 out of 10.  She has not taken anything for pain.  Pain is worse with movement.  Denies nausea, vomiting, diarrhea.  Denies any other symptoms.  History reviewed. No pertinent past medical history.  Patient Active Problem List   Diagnosis Date Noted  . Recurrent urticaria 07/10/2015  . Angioedema 07/10/2015  . History of food allergy 07/10/2015  . Routine postpartum follow-up 06/04/2010  . Contraception, device intrauterine 06/04/2010    Past Surgical History:  Procedure Laterality Date  . NO PAST SURGERIES      OB History    No data available       Home Medications    Prior to Admission medications   Medication Sig Start Date End Date Taking? Authorizing Provider  cetirizine (ZYRTEC) 10 MG tablet Take 1 tablet (10 mg total) by mouth 2 (two) times daily. 07/10/15   Bobbitt, Heywood Ilesalph Carter, MD  cyclobenzaprine (FLEXERIL) 5 MG tablet Take 1 tablet (5 mg total) 3 (three) times daily as needed by mouth for muscle spasms. 03/01/17   Apryll Hinkle, Mayer Maskerourtney F, MD  EPINEPHrine (EPIPEN 2-PAK) 0.3 mg/0.3 mL IJ SOAJ injection Inject 0.3 mLs (0.3 mg total) into the muscle once. 07/10/15   Bobbitt, Heywood Ilesalph Carter, MD  levocetirizine (XYZAL) 5 MG tablet Take 1 tablet (5 mg total) by mouth every evening. 08/20/15   Bobbitt, Heywood Ilesalph Carter, MD  montelukast (SINGULAIR) 10 MG tablet Take 1 tablet (10 mg total) by mouth at bedtime. 08/20/15   Bobbitt, Heywood Ilesalph Carter, MD  naproxen (NAPROSYN) 500 MG tablet Take 1 tablet (500 mg total)  2 (two) times daily by mouth. 03/01/17   Holten Spano, Mayer Maskerourtney F, MD  Prenatal Multivit-Min-Fe-FA 0.8 MG TABS Take 1 tablet by mouth daily. Reported on 07/10/2015    [provider]  ranitidine (ZANTAC) 150 MG tablet Take 1 tablet (150 mg total) by mouth 2 (two) times daily. 07/10/15   Bobbitt, Heywood Ilesalph Carter, MD    Family History Family History  Problem Relation Age of Onset  . Allergic rhinitis Neg Hx   . Angioedema Neg Hx   . Asthma Neg Hx   . Atopy Neg Hx   . Eczema Neg Hx   . Immunodeficiency Neg Hx   . Urticaria Neg Hx     Social History Social History   Tobacco Use  . Smoking status: Never Smoker  . Smokeless tobacco: Never Used  Substance Use Topics  . Alcohol use: No    Alcohol/week: 0.0 oz  . Drug use: No     Allergies   Patient has no known allergies.   Review of Systems Review of Systems  Constitutional: Negative for fever.  Gastrointestinal: Positive for abdominal pain. Negative for nausea and vomiting.  All other systems reviewed and are negative.    Physical Exam Updated Vital Signs BP 112/82   Pulse 81   Temp 98.3 F (36.8 C) (Oral)   Resp 16   Ht 4\' 11"  (1.499 m)   Wt  47.6 kg (105 lb)   SpO2 100%   BMI 21.21 kg/m   Physical Exam  Constitutional: She is oriented to person, place, and time. She appears well-developed and well-nourished.  HENT:  Head: Normocephalic and atraumatic.  Neck: Neck supple.  Cardiovascular: Normal rate, regular rhythm and normal heart sounds.  Pulmonary/Chest: Effort normal. No respiratory distress. She has no wheezes.  Abdominal: Soft. Normal appearance and bowel sounds are normal.  Tenderness palpation left upper abdomen along the left rectus, no obvious hematoma or bulge noted  Neurological: She is alert and oriented to person, place, and time.  Skin: Skin is warm and dry.  Psychiatric: She has a normal mood and affect.  Nursing note and vitals reviewed.    ED Treatments / Results  Labs (all labs  ordered are listed, but only abnormal results are displayed) Labs Reviewed - No data to display  EKG  EKG Interpretation None       Radiology No results found.  Procedures Procedures (including critical care time)  Medications Ordered in ED Medications  ketorolac (TORADOL) 30 MG/ML injection 30 mg (30 mg Intramuscular Given 03/01/17 0138)  cyclobenzaprine (FLEXERIL) tablet 5 mg (5 mg Oral Given 03/01/17 0138)     Initial Impression / Assessment and Plan / ED Course  I have reviewed the triage vital signs and the nursing notes.  Pertinent labs & imaging results that were available during my care of the patient were reviewed by me and considered in my medical decision making (see chart for details).     Patient presents with left abdominal pain after using an ab roller.  There is no obvious hematoma or abdominal wall blue bruising.  Given history, would have low suspicion for other intra-abdominal pathology.  She is otherwise nontoxic appearing.  She was given Flexeril and Toradol.  On repeat evaluation, patient states that she feels much better.  Will discharge home with Flexeril and naproxen as needed for discomfort.  Recommend avoiding exercises until healed.  After history, exam, and medical workup I feel the patient has been appropriately medically screened and is safe for discharge home. Pertinent diagnoses were discussed with the patient. Patient was given return precautions.   Final Clinical Impressions(s) / ED Diagnoses   Final diagnoses:  Abdominal muscle pain    ED Discharge Orders        Ordered    cyclobenzaprine (FLEXERIL) 5 MG tablet  3 times daily PRN     03/01/17 0347    naproxen (NAPROSYN) 500 MG tablet  2 times daily     03/01/17 0347       Satoria Dunlop, Mayer Maskerourtney F, MD 03/01/17 [phone]

## 2017-03-01 NOTE — ED Triage Notes (Addendum)
Pt presents by EMS for evaluation of abd pain after using ab roller. Pt states that after doing one roll she felt a pop in her stomach and now having pain. Tenderness to RUQ on palpation

## 2017-03-01 NOTE — ED Notes (Signed)
Bed: WA17 Expected date:  Expected time:  Means of arrival:  Comments: EMS-abdominal pain 

## 2019-01-06 ENCOUNTER — Other Ambulatory Visit: Payer: Self-pay

## 2019-01-06 ENCOUNTER — Emergency Department (HOSPITAL_COMMUNITY)
Admission: EM | Admit: 2019-01-06 | Discharge: 2019-01-07 | Disposition: A | Discharge status: Home / Self Care | Payer: BLUE CROSS/BLUE SHIELD | Attending: Emergency Medicine | Admitting: Emergency Medicine

## 2019-01-06 ENCOUNTER — Encounter (HOSPITAL_COMMUNITY): Payer: Self-pay | Admitting: Emergency Medicine

## 2019-01-06 DIAGNOSIS — Z5321 Procedure and treatment not carried out due to patient leaving prior to being seen by health care provider: Secondary | ICD-10-CM | POA: Diagnosis not present

## 2019-01-06 DIAGNOSIS — R109 Unspecified abdominal pain: Secondary | ICD-10-CM | POA: Diagnosis present

## 2019-01-06 MED ORDER — SODIUM CHLORIDE 0.9% FLUSH: 3.0000 mL | Freq: Once | INTRAVENOUS | Status: DC

## 2019-01-06 MED ORDER — ONDANSETRON 4 MG PO TBDP: 4.0000 mg | ORAL_TABLET | Freq: Once | ORAL | Status: DC | PRN

## 2019-01-06 NOTE — ED Triage Notes (Signed)
Pt reports pain in LLQ with radiation to flank for the last week. Pt reports dysuria as well.

## 2019-01-07 LAB — URINALYSIS, ROUTINE W REFLEX MICROSCOPIC
Bacteria, UA: NONE SEEN
Bilirubin Urine: NEGATIVE
Glucose, UA: NEGATIVE mg/dL
Hgb urine dipstick: NEGATIVE
Ketones, ur: 20 mg/dL — AB
Nitrite: NEGATIVE
Protein, ur: NEGATIVE mg/dL
Specific Gravity, Urine: 1.01 (ref 1.005–1.030)
pH: 6 (ref 5.0–8.0)

## 2019-01-07 LAB — CBC
HCT: 34.2 % — ABNORMAL LOW (ref 36.0–46.0)
Hemoglobin: 11.3 g/dL — ABNORMAL LOW (ref 12.0–15.0)
MCH: 27 pg (ref 26.0–34.0)
MCHC: 33 g/dL (ref 30.0–36.0)
MCV: 81.8 fL (ref 80.0–100.0)
Platelets: 326 10*3/uL (ref 150–400)
RBC: 4.18 MIL/uL (ref 3.87–5.11)
RDW: 12.9 % (ref 11.5–15.5)
WBC: 9.7 10*3/uL (ref 4.0–10.5)
nRBC: 0 % (ref 0.0–0.2)

## 2019-01-07 LAB — COMPREHENSIVE METABOLIC PANEL
ALT: 24 U/L (ref 0–44)
AST: 21 U/L (ref 15–41)
Albumin: 3.9 g/dL (ref 3.5–5.0)
Alkaline Phosphatase: 51 U/L (ref 38–126)
Anion gap: 11 (ref 5–15)
BUN: 9 mg/dL (ref 6–20)
CO2: 21 mmol/L — ABNORMAL LOW (ref 22–32)
Calcium: 8.8 mg/dL — ABNORMAL LOW (ref 8.9–10.3)
Chloride: 103 mmol/L (ref 98–111)
Creatinine, Ser: 0.85 mg/dL (ref 0.44–1.00)
GFR calc Af Amer: >60 mL/min (ref >60)
GFR calc non Af Amer: >60 mL/min (ref >60)
Glucose, Bld: 86 mg/dL (ref 70–99)
Potassium: 3.6 mmol/L (ref 3.5–5.1)
Sodium: 135 mmol/L (ref 135–145)
Total Bilirubin: 0.5 mg/dL (ref 0.3–1.2)
Total Protein: 7.5 g/dL (ref 6.5–8.1)

## 2019-01-07 LAB — LIPASE, BLOOD: Lipase: 29 U/L (ref 11–51)

## 2019-01-07 LAB — I-STAT BETA HCG BLOOD, ED (MC, WL, AP ONLY): I-stat hCG, quantitative: <5 m[IU]/mL (ref <5)

## 2020-10-11 ENCOUNTER — Inpatient Hospital Stay (HOSPITAL_COMMUNITY)
Admission: AD | Admit: 2020-10-11 | Discharge: 2020-10-11 | Disposition: A | Discharge status: Home / Self Care | Payer: BLUE CROSS/BLUE SHIELD | Attending: Obstetrics & Gynecology | Admitting: Obstetrics & Gynecology

## 2020-10-11 ENCOUNTER — Ambulatory Visit
Admission: EM | Admit: 2020-10-11 | Discharge: 2020-10-11 | Disposition: A | Discharge status: Nursing Home (Includes ICF) | Payer: BLUE CROSS/BLUE SHIELD

## 2020-10-11 ENCOUNTER — Other Ambulatory Visit: Payer: Self-pay

## 2020-10-11 ENCOUNTER — Inpatient Hospital Stay (HOSPITAL_COMMUNITY): Payer: BLUE CROSS/BLUE SHIELD

## 2020-10-11 ENCOUNTER — Encounter (HOSPITAL_COMMUNITY): Payer: Self-pay | Admitting: *Deleted

## 2020-10-11 DIAGNOSIS — Z79899 Other long term (current) drug therapy: Secondary | ICD-10-CM | POA: Diagnosis not present

## 2020-10-11 DIAGNOSIS — O209 Hemorrhage in early pregnancy, unspecified: Secondary | ICD-10-CM

## 2020-10-11 DIAGNOSIS — Z3A01 Less than 8 weeks gestation of pregnancy: Secondary | ICD-10-CM | POA: Diagnosis not present

## 2020-10-11 DIAGNOSIS — O3110X2 Continuing pregnancy after spontaneous abortion of one fetus or more, unspecified trimester, fetus 2: Secondary | ICD-10-CM | POA: Diagnosis not present

## 2020-10-11 DIAGNOSIS — O4691 Antepartum hemorrhage, unspecified, first trimester: Secondary | ICD-10-CM | POA: Diagnosis not present

## 2020-10-11 DIAGNOSIS — O30041 Twin pregnancy, dichorionic/diamniotic, first trimester: Secondary | ICD-10-CM | POA: Insufficient documentation

## 2020-10-11 DIAGNOSIS — O26891 Other specified pregnancy related conditions, first trimester: Secondary | ICD-10-CM | POA: Insufficient documentation

## 2020-10-11 DIAGNOSIS — O4690 Antepartum hemorrhage, unspecified, unspecified trimester: Secondary | ICD-10-CM

## 2020-10-11 DIAGNOSIS — R109 Unspecified abdominal pain: Secondary | ICD-10-CM | POA: Diagnosis not present

## 2020-10-11 DIAGNOSIS — O26851 Spotting complicating pregnancy, first trimester: Secondary | ICD-10-CM | POA: Diagnosis not present

## 2020-10-11 DIAGNOSIS — Z3A12 12 weeks gestation of pregnancy: Secondary | ICD-10-CM | POA: Diagnosis not present

## 2020-10-11 LAB — COMPREHENSIVE METABOLIC PANEL
ALT: 15 U/L (ref 0–44)
AST: 19 U/L (ref 15–41)
Albumin: 3.9 g/dL (ref 3.5–5.0)
Alkaline Phosphatase: 44 U/L (ref 38–126)
Anion gap: 7 (ref 5–15)
BUN: 7 mg/dL (ref 6–20)
CO2: 26 mmol/L (ref 22–32)
Calcium: 9.3 mg/dL (ref 8.9–10.3)
Chloride: 103 mmol/L (ref 98–111)
Creatinine, Ser: 0.69 mg/dL (ref 0.44–1.00)
GFR, Estimated: >60 mL/min (ref >60)
Glucose, Bld: 101 mg/dL — ABNORMAL HIGH (ref 70–99)
Potassium: 4.2 mmol/L (ref 3.5–5.1)
Sodium: 136 mmol/L (ref 135–145)
Total Bilirubin: 0.3 mg/dL (ref 0.3–1.2)
Total Protein: 7.3 g/dL (ref 6.5–8.1)

## 2020-10-11 LAB — CBC
HCT: 34.9 % — ABNORMAL LOW (ref 36.0–46.0)
Hemoglobin: 11.8 g/dL — ABNORMAL LOW (ref 12.0–15.0)
MCH: 27.5 pg (ref 26.0–34.0)
MCHC: 33.8 g/dL (ref 30.0–36.0)
MCV: 81.4 fL (ref 80.0–100.0)
Platelets: 346 10*3/uL (ref 150–400)
RBC: 4.29 MIL/uL (ref 3.87–5.11)
RDW: 13.2 % (ref 11.5–15.5)
WBC: 8.2 10*3/uL (ref 4.0–10.5)
nRBC: 0 % (ref 0.0–0.2)

## 2020-10-11 LAB — HCG, QUANTITATIVE, PREGNANCY: hCG, Beta Chain, Quant, S: 30423 m[IU]/mL — ABNORMAL HIGH (ref <5)

## 2020-10-11 LAB — URINALYSIS, ROUTINE W REFLEX MICROSCOPIC
Bilirubin Urine: NEGATIVE
Glucose, UA: NEGATIVE mg/dL
Ketones, ur: NEGATIVE mg/dL
Nitrite: NEGATIVE
Protein, ur: NEGATIVE mg/dL
Specific Gravity, Urine: <1.005 — ABNORMAL LOW (ref 1.005–1.030)
pH: 7 (ref 5.0–8.0)

## 2020-10-11 LAB — WET PREP, GENITAL
Sperm: NONE SEEN
Trich, Wet Prep: NONE SEEN
WBC, Wet Prep HPF POC: NONE SEEN
Yeast Wet Prep HPF POC: NONE SEEN

## 2020-10-11 LAB — URINALYSIS, MICROSCOPIC (REFLEX)

## 2020-10-11 LAB — ABO/RH: ABO/RH(D): A POS

## 2020-10-11 LAB — POCT PREGNANCY, URINE: Preg Test, Ur: POSITIVE — AB

## 2020-10-11 NOTE — MAU Provider Note (Addendum)
None     Chief Complaint:  Vaginal Bleeding   Danielle Weiss is  35 y.o. G1P0 at [redacted]w[redacted]d presents complaining of Vaginal Bleeding .  She has had some spotting over the past few days, increased some today.  Light cramping . Only spotting now, dark. Denies itching or irritation or odor.  Obstetrical/Gynecological History: OB History     Gravida  1   Para      Term      Preterm      AB      Living         SAB      IAB      Ectopic      Multiple      Live Births             Past Medical History: No past medical history on file.  Past Surgical History: Past Surgical History:  Procedure Laterality Date   NO PAST SURGERIES      Family History: Family History  Problem Relation Age of Onset   Allergic rhinitis Neg Hx    Angioedema Neg Hx    Asthma Neg Hx    Atopy Neg Hx    Eczema Neg Hx    Immunodeficiency Neg Hx    Urticaria Neg Hx     Social History: Social History   Tobacco Use   Smoking status: Never   Smokeless tobacco: Never  Substance Use Topics   Alcohol use: No    Alcohol/week: 0.0 standard drinks   Drug use: No    Allergies: No Known Allergies  Meds:  Medications Prior to Admission  Medication Sig Dispense Refill Last Dose   cetirizine (ZYRTEC) 10 MG tablet Take 1 tablet (10 mg total) by mouth 2 (two) times daily. 60 tablet 5    cyclobenzaprine (FLEXERIL) 5 MG tablet Take 1 tablet (5 mg total) 3 (three) times daily as needed by mouth for muscle spasms. 10 tablet 0    EPINEPHrine (EPIPEN 2-PAK) 0.3 mg/0.3 mL IJ SOAJ injection Inject 0.3 mLs (0.3 mg total) into the muscle once. 1 Device 1    levocetirizine (XYZAL) 5 MG tablet Take 1 tablet (5 mg total) by mouth every evening. 30 tablet 5    montelukast (SINGULAIR) 10 MG tablet Take 1 tablet (10 mg total) by mouth at bedtime. 30 tablet 5    naproxen (NAPROSYN) 500 MG tablet Take 1 tablet (500 mg total) 2 (two) times daily by mouth. 30 tablet 0    Prenatal Multivit-Min-Fe-FA 0.8 MG TABS Take  1 tablet by mouth daily. Reported on 07/10/2015      ranitidine (ZANTAC) 150 MG tablet Take 1 tablet (150 mg total) by mouth 2 (two) times daily. 60 tablet 5     Review of Systems   Constitutional: Negative for fever and chills Eyes: Negative for visual disturbances Respiratory: Negative for shortness of breath, dyspnea Cardiovascular: Negative for chest pain or palpitations  Gastrointestinal: Negative for vomiting, diarrhea and constipation Genitourinary: Negative for dysuria and urgency Musculoskeletal: Negative for back pain, joint pain, myalgias.  Normal ROM  Neurological: Negative for dizziness and headaches    Physical Exam  Blood pressure 114/81, pulse 87, temperature 98.5 F (36.9 C), temperature source Oral, resp. rate 18, height 4\' 9"  (1.448 m), weight 49.5 kg, last menstrual period 07/19/2020, SpO2 99 %. GENERAL: Well-developed, well-nourished female in no acute distress.  LUNGS: Normal respiratory effort HEART: Regular rate and rhythm. ABDOMEN: Soft, nontender, nondistendeD EXTREMITIES: Nontender, no edema, 2+ distal  pulses. DTR's 2+ PELVIC EXAM: SSE:  scant amount of brown/yellow discharge. Cx closed, non friable   Labs: Results for orders placed or performed during the hospital encounter of 10/11/20 (from the past 24 hour(s))  Pregnancy, urine POC   Collection Time: 10/11/20  5:49 PM  Result Value Ref Range   Preg Test, Ur POSITIVE (A) NEGATIVE  Urinalysis, Routine w reflex microscopic Urine, Clean Catch   Collection Time: 10/11/20  6:05 PM  Result Value Ref Range   Color, Urine YELLOW YELLOW   APPearance CLEAR CLEAR   Specific Gravity, Urine <1.005 (L) 1.005 - 1.030   pH 7.0 5.0 - 8.0   Glucose, UA NEGATIVE NEGATIVE mg/dL   Hgb urine dipstick MODERATE (A) NEGATIVE   Bilirubin Urine NEGATIVE NEGATIVE   Ketones, ur NEGATIVE NEGATIVE mg/dL   Protein, ur NEGATIVE NEGATIVE mg/dL   Nitrite NEGATIVE NEGATIVE   Leukocytes,Ua SMALL (A) NEGATIVE  Urinalysis,  Microscopic (reflex)   Collection Time: 10/11/20  6:05 PM  Result Value Ref Range   RBC / HPF 0-5 0 - 5 RBC/hpf   WBC, UA 6-10 0 - 5 WBC/hpf   Bacteria, UA RARE (A) NONE SEEN   Squamous Epithelial / LPF 0-5 0 - 5  Wet prep, genital   Collection Time: 10/11/20  6:35 PM   Specimen: Vaginal  Result Value Ref Range   Yeast Wet Prep HPF POC NONE SEEN NONE SEEN   Trich, Wet Prep NONE SEEN NONE SEEN   Clue Cells Wet Prep HPF POC PRESENT (A) NONE SEEN   WBC, Wet Prep HPF POC NONE SEEN NONE SEEN   Sperm NONE SEEN   CBC   Collection Time: 10/11/20  6:48 PM  Result Value Ref Range   WBC 8.2 4.0 - 10.5 K/uL   RBC 4.29 3.87 - 5.11 MIL/uL   Hemoglobin 11.8 (L) 12.0 - 15.0 g/dL   HCT 26.7 (L) 12.4 - 58.0 %   MCV 81.4 80.0 - 100.0 fL   MCH 27.5 26.0 - 34.0 pg   MCHC 33.8 30.0 - 36.0 g/dL   RDW 99.8 33.8 - 25.0 %   Platelets 346 150 - 400 K/uL   nRBC 0.0 0.0 - 0.2 %  Comprehensive metabolic panel   Collection Time: 10/11/20  6:48 PM  Result Value Ref Range   Sodium 136 135 - 145 mmol/L   Potassium 4.2 3.5 - 5.1 mmol/L   Chloride 103 98 - 111 mmol/L   CO2 26 22 - 32 mmol/L   Glucose, Bld 101 (H) 70 - 99 mg/dL   BUN 7 6 - 20 mg/dL   Creatinine, Ser 5.39 0.44 - 1.00 mg/dL   Calcium 9.3 8.9 - 76.7 mg/dL   Total Protein 7.3 6.5 - 8.1 g/dL   Albumin 3.9 3.5 - 5.0 g/dL   AST 19 15 - 41 U/L   ALT 15 0 - 44 U/L   Alkaline Phosphatase 44 38 - 126 U/L   Total Bilirubin 0.3 0.3 - 1.2 mg/dL   GFR, Estimated >34 >19 mL/min   Anion gap 7 5 - 15  hCG, quantitative, pregnancy   Collection Time: 10/11/20  6:48 PM  Result Value Ref Range   hCG, Beta Chain, Quant, S 30,423 (H) <5 mIU/mL  ABO/Rh   Collection Time: 10/11/20  6:48 PM  Result Value Ref Range   ABO/RH(D) A POS    No rh immune globuloin      NOT A RH IMMUNE GLOBULIN CANDIDATE, PT RH POSITIVE Performed at  [facility], [address], [place], [place] [address]    Imaging Studies:  [place] Comp AddL Gest Less 14 Wks  Result  Date: 10/11/2020 CLINICAL DATA:  Vaginal bleeding. EXAM: TWIN OBSTETRIC <14WK [place] AND TRANSVAGINAL OB US TECHNIQUE: Both transabdominal and transvaginal ultrasound examinations were performed for complete evaluation of the gestation as well as the maternal uterus, adnexal regions, and pelvic cul-de-sac. Transvaginal technique was performed to assess early pregnancy. COMPARISON:  None. FINDINGS: Number of IUPs:  2 Chorionicity/Amnionicity:  Dichorionic-diamniotic (thick membrane) TWIN 1 Yolk sac:  Visualized. Embryo:  Visualized. Cardiac Activity: Not Visualized. CRL:  3.2 mm   5 w 6 d                  [place] EDC: June 07, 2021. TWIN 2 Yolk sac:  Visualized. Embryo:  Not Visualized. Cardiac Activity: Not Visualized. MSD: 16.8 mm   6 w   3 d Subchorionic hemorrhage:  None visualized. Maternal uterus/adnexae: Ovaries are unremarkable. No free fluid is noted. IMPRESSION: Twin gestation is noted. One twin demonstrates yolk sac and fetal pole, but no fetal cardiac activity is noted. The other twin gestation demonstrates only a gestational sac, without yolk sac or embryo. Recommend follow-up quantitative B-HCG levels and follow-up US in 14 days to assess viability. This recommendation follows SRU consensus guidelines: Diagnostic Criteria for Nonviable Pregnancy Early in the First Trimester. Malva Limes Med 2013; 258:5277-82. Electronically Signed   By: Lupita Raider M.D.   On: 10/11/2020 21:12   US OB LESS THAN 14 WEEKS WITH OB TRANSVAGINAL  Result Date: 10/11/2020 CLINICAL DATA:  Vaginal bleeding. EXAM: TWIN OBSTETRIC <14WK [place] AND TRANSVAGINAL OB US TECHNIQUE: Both transabdominal and transvaginal ultrasound examinations were performed for complete evaluation of the gestation as well as the maternal uterus, adnexal regions, and pelvic cul-de-sac. Transvaginal technique was performed to assess early pregnancy. COMPARISON:  None. FINDINGS: Number of IUPs:  2 Chorionicity/Amnionicity:  Dichorionic-diamniotic (thick  membrane) TWIN 1 Yolk sac:  Visualized. Embryo:  Visualized. Cardiac Activity: Not Visualized. CRL:  3.2 mm   5 w 6 d                  [place] EDC: June 07, 2021. TWIN 2 Yolk sac:  Visualized. Embryo:  Not Visualized. Cardiac Activity: Not Visualized. MSD: 16.8 mm   6 w   3 d Subchorionic hemorrhage:  None visualized. Maternal uterus/adnexae: Ovaries are unremarkable. No free fluid is noted. IMPRESSION: Twin gestation is noted. One twin demonstrates yolk sac and fetal pole, but no fetal cardiac activity is noted. The other twin gestation demonstrates only a gestational sac, without yolk sac or embryo. Recommend follow-up quantitative B-HCG levels and follow-up US in 14 days to assess viability. This recommendation follows SRU consensus guidelines: Diagnostic Criteria for Nonviable Pregnancy Early in the First Trimester. Malva Limes Med 2013; 423:5361-44. Electronically Signed   By: Lupita Raider M.D.   On: 10/11/2020 21:12    Assessment: Danielle Weiss is  35 y.o. G1P0 at 5.6 weeks presents with Vanishing Twin A Twin B fetal pole, no FCA--too early vs non viable.  Plan: Options discussed:  pt and husband wish to come back on Sunday morning for repeat HCG (prefer to have results called rather than wait, even if the news is not good).  Repeat US ordered for ~ 2 weeks--pt to expect call to schedule.   Danielle Weiss 6/30/202210:04 PM

## 2020-10-11 NOTE — ED Triage Notes (Signed)
Pt with "Pregnancy Verification Form" documentation from [facility].  Per paperwork, pt tested positive 09/25/20, estimated at that date to be 9 wks, 5 days pregnant. Pt c/o starting with vaginal spotting and abd cramping 2 days ago, with increase to vaginal bleeding yesterday.  Denies lightheadedness.  Pt accompanied by spouse.

## 2020-10-11 NOTE — MAU Note (Signed)
Presents with c/o VB, reports was spotting but between yesterday and today VB has increased.  Denies soaking sanitary napkin or passing blood clots.  LMP 07/19/2020.  Has pregnancy verification letter from [facility].

## 2020-10-12 LAB — GC/CHLAMYDIA PROBE AMP (~~LOC~~) NOT AT ARMC
Chlamydia: NEGATIVE
Comment: NEGATIVE
Comment: NORMAL
Neisseria Gonorrhea: NEGATIVE

## 2020-10-14 ENCOUNTER — Other Ambulatory Visit: Payer: Self-pay

## 2020-10-14 ENCOUNTER — Inpatient Hospital Stay (HOSPITAL_COMMUNITY)
Admission: AD | Admit: 2020-10-14 | Discharge: 2020-10-14 | Disposition: A | Discharge status: Home / Self Care | Payer: BLUE CROSS/BLUE SHIELD | Attending: Obstetrics and Gynecology | Admitting: Obstetrics and Gynecology

## 2020-10-14 DIAGNOSIS — O209 Hemorrhage in early pregnancy, unspecified: Secondary | ICD-10-CM | POA: Diagnosis present

## 2020-10-14 DIAGNOSIS — O021 Missed abortion: Secondary | ICD-10-CM

## 2020-10-14 DIAGNOSIS — O30041 Twin pregnancy, dichorionic/diamniotic, first trimester: Secondary | ICD-10-CM

## 2020-10-14 DIAGNOSIS — Z3A12 12 weeks gestation of pregnancy: Secondary | ICD-10-CM | POA: Diagnosis not present

## 2020-10-14 LAB — HCG, QUANTITATIVE, PREGNANCY: hCG, Beta Chain, Quant, S: 26474 m[IU]/mL — ABNORMAL HIGH (ref <5)

## 2020-10-14 NOTE — MAU Note (Signed)
Pt reports to [facility] for follow up lab work.  Reports bleeding is slightly heavier, but only having to change her pad once a day.  Reports pain is less than her last visit in [facility]

## 2020-10-14 NOTE — MAU Provider Note (Signed)
History     CSN: [id number]  Arrival date and time: 10/14/20 0936   Event Date/Time   First Provider Initiated Contact with Patient 10/14/20 [phone]      Chief Complaint  Patient presents with   Follow-up   Danielle Weiss is a 35 y.o. G1P0 at [redacted]w[redacted]d who presents today for FU HCG. She is also having more bleeding today than she was when she was here 4 days ago. She is also having cramping.   Vaginal Bleeding The patient's primary symptoms include pelvic pain and vaginal bleeding. This is a new problem. The current episode started in the past 7 days. The problem occurs intermittently. The problem has been gradually worsening. The problem affects both sides. The vaginal discharge was bloody. The vaginal bleeding is lighter than menses. Nothing aggravates the symptoms. She has tried nothing for the symptoms.   OB History     Gravida  1   Para      Term      Preterm      AB      Living         SAB      IAB      Ectopic      Multiple      Live Births              No past medical history on file.  Past Surgical History:  Procedure Laterality Date   NO PAST SURGERIES      Family History  Problem Relation Age of Onset   Allergic rhinitis Neg Hx    Angioedema Neg Hx    Asthma Neg Hx    Atopy Neg Hx    Eczema Neg Hx    Immunodeficiency Neg Hx    Urticaria Neg Hx     Social History   Tobacco Use   Smoking status: Never   Smokeless tobacco: Never  Substance Use Topics   Alcohol use: No    Alcohol/week: 0.0 standard drinks   Drug use: No    Allergies: No Known Allergies  Medications Prior to Admission  Medication Sig Dispense Refill Last Dose   EPINEPHrine (EPIPEN 2-PAK) 0.3 mg/0.3 mL IJ SOAJ injection Inject 0.3 mLs (0.3 mg total) into the muscle once. 1 Device 1    Prenatal Multivit-Min-Fe-FA 0.8 MG TABS Take 1 tablet by mouth daily. Reported on 07/10/2015       Review of Systems  Genitourinary:  Positive for pelvic pain and vaginal bleeding.  All  other systems reviewed and are negative. Physical Exam   Blood pressure 123/77, pulse 73, temperature 98.1 F (36.7 C), temperature source Oral, resp. rate 15, last menstrual period 07/19/2020, SpO2 100 %.  Physical Exam Vitals and nursing note reviewed.  Constitutional:      Appearance: She is well-developed.  HENT:     Head: Normocephalic.  Eyes:     Pupils: Pupils are equal, round, and reactive to light.  Cardiovascular:     Rate and Rhythm: Normal rate and regular rhythm.     Heart sounds: Normal heart sounds.  Pulmonary:     Effort: Pulmonary effort is normal. No respiratory distress.     Breath sounds: Normal breath sounds.  Abdominal:     Palpations: Abdomen is soft.     Tenderness: There is no abdominal tenderness.  Genitourinary:    Vagina: No bleeding.     Comments:    Musculoskeletal:        General: Normal range of motion.  Cervical back: Normal range of motion and neck supple.  Skin:    General: Skin is warm and dry.  Neurological:     Mental Status: She is alert and oriented to person, place, and time.  Psychiatric:        Mood and Affect: Mood normal.        Behavior: Behavior normal.   Results for Danielle Weiss, Danielle Weiss [facility] (MRN [id number]) as of 10/14/2020 11:35  Ref. Range 10/11/2020 18:48 10/11/2020 21:00 10/14/2020 09:46  HCG, Beta Chain, Quant, S Latest Ref Range: <5 mIU/mL 30,423 (H)  26,474 (H)   MAU Course  Procedures  MDM HCG has decreased in approx 48 hours, but not by at least 50%. However, patient has documented IUP. So even without 50% decline this decline does still represent SAB. Options reviewed with patient including treatment with cytotec or continued expectant management with FU in clinic in 2 weeks. Patient would like to continue with expectant management at this time. Questions answered and what to expect with bleeding reviewed with the patient.   Assessment and Plan   1. Missed abortion   2. 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   Specialty: Obstetrics and Gynecology Why: They will call you with an appointment Contact information: [address] [place] [id number] [phone]               Danielle Sheller DNP, CNM  10/14/20  11:40 AM

## 2020-10-23 ENCOUNTER — Inpatient Hospital Stay (HOSPITAL_COMMUNITY): Payer: BLUE CROSS/BLUE SHIELD

## 2020-10-23 ENCOUNTER — Inpatient Hospital Stay (HOSPITAL_COMMUNITY)
Admission: AD | Admit: 2020-10-23 | Discharge: 2020-10-23 | Disposition: A | Discharge status: Home / Self Care | Payer: BLUE CROSS/BLUE SHIELD | Attending: Obstetrics & Gynecology | Admitting: Obstetrics & Gynecology

## 2020-10-23 ENCOUNTER — Other Ambulatory Visit: Payer: Self-pay

## 2020-10-23 ENCOUNTER — Encounter (HOSPITAL_COMMUNITY): Payer: Self-pay | Admitting: Obstetrics & Gynecology

## 2020-10-23 DIAGNOSIS — Z6790 Unspecified blood type, Rh positive: Secondary | ICD-10-CM | POA: Diagnosis not present

## 2020-10-23 DIAGNOSIS — O3680X1 Pregnancy with inconclusive fetal viability, fetus 1: Secondary | ICD-10-CM | POA: Insufficient documentation

## 2020-10-23 DIAGNOSIS — O4691 Antepartum hemorrhage, unspecified, first trimester: Secondary | ICD-10-CM

## 2020-10-23 DIAGNOSIS — O3680X0 Pregnancy with inconclusive fetal viability, not applicable or unspecified: Secondary | ICD-10-CM | POA: Diagnosis not present

## 2020-10-23 DIAGNOSIS — O021 Missed abortion: Secondary | ICD-10-CM | POA: Diagnosis not present

## 2020-10-23 DIAGNOSIS — O4690 Antepartum hemorrhage, unspecified, unspecified trimester: Secondary | ICD-10-CM

## 2020-10-23 DIAGNOSIS — Z3A13 13 weeks gestation of pregnancy: Secondary | ICD-10-CM

## 2020-10-23 HISTORY — DX: Other specified health status: Z78.9

## 2020-10-23 LAB — CBC WITH DIFFERENTIAL/PLATELET
Abs Immature Granulocytes: 0.02 10*3/uL (ref 0.00–0.07)
Basophils Absolute: 0 10*3/uL (ref 0.0–0.1)
Basophils Relative: 0 %
Eosinophils Absolute: 0.1 10*3/uL (ref 0.0–0.5)
Eosinophils Relative: 1 %
HCT: 33.2 % — ABNORMAL LOW (ref 36.0–46.0)
Hemoglobin: 11.2 g/dL — ABNORMAL LOW (ref 12.0–15.0)
Immature Granulocytes: 0 %
Lymphocytes Relative: 18 %
Lymphs Abs: 1.5 10*3/uL (ref 0.7–4.0)
MCH: 27.3 pg (ref 26.0–34.0)
MCHC: 33.7 g/dL (ref 30.0–36.0)
MCV: 81 fL (ref 80.0–100.0)
Monocytes Absolute: 0.4 10*3/uL (ref 0.1–1.0)
Monocytes Relative: 5 %
Neutro Abs: 6.6 10*3/uL (ref 1.7–7.7)
Neutrophils Relative %: 76 %
Platelets: 346 10*3/uL (ref 150–400)
RBC: 4.1 MIL/uL (ref 3.87–5.11)
RDW: 13 % (ref 11.5–15.5)
WBC: 8.6 10*3/uL (ref 4.0–10.5)
nRBC: 0 % (ref 0.0–0.2)

## 2020-10-23 LAB — HCG, QUANTITATIVE, PREGNANCY: hCG, Beta Chain, Quant, S: 8016 m[IU]/mL — ABNORMAL HIGH (ref <5)

## 2020-10-23 NOTE — Discharge Instructions (Signed)

## 2020-10-23 NOTE — MAU Note (Signed)
Danielle Weiss is a 35 y.o. at [redacted]w[redacted]d here in MAU reporting: known AB, has been bleeding but states it got heavier this AM with blood clots. Reports it is a moderate amount of bleeding with a few large clots. Has changed her pad 5-6 times. Having back and abdominal pain.   Onset of complaint: today  Pain score: back 5/10, abdomen 7/10  Vitals:   10/23/20 1017  BP: 115/81  Pulse: 87  Resp: 16  Temp: 98.4 F (36.9 C)  SpO2: 100%     Lab orders placed from triage: none

## 2020-10-23 NOTE — MAU Provider Note (Signed)
History     CSN: [id number]  Arrival date and time: 10/23/20 1001   Event Date/Time   First Provider Initiated Contact with Patient 10/23/20 1128      Chief Complaint  Patient presents with   Back Pain   Abdominal Pain   Vaginal Bleeding   [name] is a 35 y.o. G3P2 at [redacted]w[redacted]d who presents to MAU for vaginal bleeding which began 10/11/2020. Patient was seen in MAU on 10/14/2020 and diagnosed with a missed AB and elected expectant management after counseling. Patient reports until this morning the bleeding had been minimal, but this morning she passed a few large clots and changed her pad 3-4 times. Patient has almost no bleeding on pad in MAU. Patient states she was told to return to MAU with heavier bleeding.  Passing blood clots? Per above Blood soaking clothes? no Lightheaded/dizzy? no Significant pelvic pain or cramping? Intermittent, pt states she is not having pain at this time and when she does the pain feels like menstrual cramps in her pelvis and she also feels some cramping in her lower back  Blood Type? A Positive Current PNC & next appt? [facility], 11/05/2020  Pt denies vaginal discharge/odor/itching. Pt denies N/V, abdominal pain, constipation, diarrhea, or urinary problems. Pt denies fever, chills, fatigue, sweating or changes in appetite. Pt denies SOB or chest pain. Pt denies dizziness, HA, light-headedness, weakness.   OB History     Gravida  3   Para  2   Term      Preterm      AB      Living         SAB      IAB      Ectopic      Multiple      Live Births  2           Past Medical History:  Diagnosis Date   Medical history non-contributory     Past Surgical History:  Procedure Laterality Date   NO PAST SURGERIES      Family History  Problem Relation Age of Onset   Hypertension Mother    Allergic rhinitis Neg Hx    Angioedema Neg Hx    Asthma Neg Hx    Atopy Neg Hx    Eczema Neg Hx    Immunodeficiency Neg Hx     Urticaria Neg Hx     Social History   Tobacco Use   Smoking status: Never   Smokeless tobacco: Never  Substance Use Topics   Alcohol use: No    Alcohol/week: 0.0 standard drinks   Drug use: No    Allergies: No Known Allergies  Medications Prior to Admission  Medication Sig Dispense Refill Last Dose   EPINEPHrine (EPIPEN 2-PAK) 0.3 mg/0.3 mL IJ SOAJ injection Inject 0.3 mLs (0.3 mg total) into the muscle once. 1 Device 1    Prenatal Multivit-Min-Fe-FA 0.8 MG TABS Take 1 tablet by mouth daily. Reported on 07/10/2015       Review of Systems  Constitutional:  Negative for chills, diaphoresis, fatigue and fever.  Eyes:  Negative for visual disturbance.  Respiratory:  Negative for shortness of breath.   Cardiovascular:  Negative for chest pain.  Gastrointestinal:  Negative for abdominal pain, constipation, diarrhea, nausea and vomiting.  Genitourinary:  Positive for pelvic pain and vaginal bleeding. Negative for dysuria, flank pain, frequency, urgency and vaginal discharge.  Musculoskeletal:  Positive for back pain.  Neurological:  Negative for dizziness, weakness, light-headedness and  headaches.   Physical Exam   Blood pressure 114/77, pulse 80, temperature 98.5 F (36.9 C), temperature source Oral, resp. rate 18, weight 48.3 kg, last menstrual period 07/19/2020, SpO2 100 %.  Patient Vitals for the past 24 hrs:  BP Temp Temp src Pulse Resp SpO2 Weight  10/23/20 1449 114/77 98.5 F (36.9 C) Oral 80 18 100 % --  10/23/20 1028 107/72 -- -- 79 -- -- --  10/23/20 1017 115/81 98.4 F (36.9 C) Oral 87 16 100 % --  10/23/20 1012 -- -- -- -- -- -- 48.3 kg   Physical Exam Vitals and nursing note reviewed.  Constitutional:      General: She is not in acute distress.    Appearance: Normal appearance. She is not ill-appearing, toxic-appearing or diaphoretic.  HENT:     Head: Normocephalic and atraumatic.  Pulmonary:     Effort: Pulmonary effort is normal.  Neurological:      Mental Status: She is alert and oriented to person, place, and time.  Psychiatric:        Mood and Affect: Mood normal.        Behavior: Behavior normal.        Thought Content: Thought content normal.        Judgment: Judgment normal.   Results for orders placed or performed during the hospital encounter of 10/23/20 (from the past 24 hour(s))  CBC with Differential/Platelet     Status: Abnormal   Collection Time: 10/23/20 12:20 PM  Result Value Ref Range   WBC 8.6 4.0 - 10.5 K/uL   RBC 4.10 3.87 - 5.11 MIL/uL   Hemoglobin 11.2 (L) 12.0 - 15.0 g/dL   HCT 16.1 (L) 09.6 - 04.5 %   MCV 81.0 80.0 - 100.0 fL   MCH 27.3 26.0 - 34.0 pg   MCHC 33.7 30.0 - 36.0 g/dL   RDW 40.9 81.1 - 91.4 %   Platelets 346 150 - 400 K/uL   nRBC 0.0 0.0 - 0.2 %   Neutrophils Relative % 76 %   Neutro Abs 6.6 1.7 - 7.7 K/uL   Lymphocytes Relative 18 %   Lymphs Abs 1.5 0.7 - 4.0 K/uL   Monocytes Relative 5 %   Monocytes Absolute 0.4 0.1 - 1.0 K/uL   Eosinophils Relative 1 %   Eosinophils Absolute 0.1 0.0 - 0.5 K/uL   Basophils Relative 0 %   Basophils Absolute 0.0 0.0 - 0.1 K/uL   Immature Granulocytes 0 %   Abs Immature Granulocytes 0.02 0.00 - 0.07 K/uL  hCG, quantitative, pregnancy     Status: Abnormal   Collection Time: 10/23/20 12:21 PM  Result Value Ref Range   hCG, Beta Chain, Quant, S 8,016 (H) <5 mIU/mL    US OB Comp AddL Gest Less 14 Wks  Addendum Date: 10/23/2020   ADDENDUM REPORT: 10/23/2020 15:29 ADDENDUM: The following is a comparison with the original scan from 10/11/2020: On the initial scan twin gestation 1 had an embryo without cardiac activity. On today's exam absent cardiac activity with twin 1 embryo is again documented compatible with definite failed pregnancy. On the initial scan twin gestation 2 had no embryo. On today's exam there is an embryo with a crown-rump length of 3.2 mm without cardiac activity consistent with suspicious for failed pregnancy. Electronically Signed   By:  Signa Kell M.D.   On: 10/23/2020 15:29   Result Date: 10/23/2020 CLINICAL DATA:  Vaginal bleeding, abdominal pain and back pain. EXAM: TWIN OBSTETRIC <14WK  [place] AND TRANSVAGINAL OB US TECHNIQUE: Both transabdominal and transvaginal ultrasound examinations were performed for complete evaluation of the gestation as well as the maternal uterus, adnexal regions, and pelvic cul-de-sac. Transvaginal technique was performed to assess early pregnancy. COMPARISON:  None. FINDINGS: Number of IUPs:  2 Chorionicity/Amnionicity:  Dichorionic-diamniotic (thick membrane) TWIN 1 Yolk sac:  Yes Embryo:  Yes Cardiac Activity: No Heart Rate: n/a bpm CRL:  3.9 mm   6 w 0 d                  [place] EDC: 06/18/2021 TWIN 2 Yolk sac:  Yes Embryo:  Yes Cardiac Activity: No Heart Rate: N/a bpm CRL:  3.2 mm   5 w 6 d                  [place] EDC: 06/19/2021 Subchorionic hemorrhage:  None visualized. Maternal uterus/adnexae: Right ovary: Normal Left ovary: Normal Other :None Free fluid:  None IMPRESSION: 1. Twin gestation noted. There are 2 gestational sacs each containing a yolk sac and embryo. No cardiac activity associated within either embryo. Findings are suspicious but not yet definitive for failed twin pregnancies. Recommend follow-up US in 10-14 days for definitive diagnosis. This recommendation follows SRU consensus guidelines: Diagnostic Criteria for Nonviable Pregnancy Early in the First Trimester. Malva Limes Med 2013; 004:5997-74. Electronically Signed: By: Signa Kell M.D. On: 10/23/2020 14:23   US OB Comp AddL Gest Less 14 Wks  Result Date: 10/11/2020 CLINICAL DATA:  Vaginal bleeding. EXAM: TWIN OBSTETRIC <14WK [place] AND TRANSVAGINAL OB US TECHNIQUE: Both transabdominal and transvaginal ultrasound examinations were performed for complete evaluation of the gestation as well as the maternal uterus, adnexal regions, and pelvic cul-de-sac. Transvaginal technique was performed to assess early pregnancy. COMPARISON:  None. FINDINGS: Number  of IUPs:  2 Chorionicity/Amnionicity:  Dichorionic-diamniotic (thick membrane) TWIN 1 Yolk sac:  Visualized. Embryo:  Visualized. Cardiac Activity: Not Visualized. CRL:  3.2 mm   5 w 6 d                  [place] EDC: June 07, 2021. TWIN 2 Yolk sac:  Visualized. Embryo:  Not Visualized. Cardiac Activity: Not Visualized. MSD: 16.8 mm   6 w   3 d Subchorionic hemorrhage:  None visualized. Maternal uterus/adnexae: Ovaries are unremarkable. No free fluid is noted. IMPRESSION: Twin gestation is noted. One twin demonstrates yolk sac and fetal pole, but no fetal cardiac activity is noted. The other twin gestation demonstrates only a gestational sac, without yolk sac or embryo. Recommend follow-up quantitative B-HCG levels and follow-up US in 14 days to assess viability. This recommendation follows SRU consensus guidelines: Diagnostic Criteria for Nonviable Pregnancy Early in the First Trimester. Malva Limes Med 2013; 142:3953-20. Electronically Signed   By: Lupita Raider M.D.   On: 10/11/2020 21:12   US OB LESS THAN 14 WEEKS WITH OB TRANSVAGINAL  Addendum Date: 10/23/2020   ADDENDUM REPORT: 10/23/2020 15:29 ADDENDUM: The following is a comparison with the original scan from 10/11/2020: On the initial scan twin gestation 1 had an embryo without cardiac activity. On today's exam absent cardiac activity with twin 1 embryo is again documented compatible with definite failed pregnancy. On the initial scan twin gestation 2 had no embryo. On today's exam there is an embryo with a crown-rump length of 3.2 mm without cardiac activity consistent with suspicious for failed pregnancy. Electronically Signed   By: Signa Kell M.D.   On: 10/23/2020 15:29  Result Date: 10/23/2020 CLINICAL DATA:  Vaginal bleeding, abdominal pain and back pain. EXAM: TWIN OBSTETRIC <14WK US AND TRANSVAGINAL OB US TECHNIQUE: Both transabdominal and transvaginal ultrasound examinations were performed for complete evaluation of the gestation as well as  the maternal uterus, adnexal regions, and pelvic cul-de-sac. Transvaginal technique was performed to assess early pregnancy. COMPARISON:  None. FINDINGS: Number of IUPs:  2 Chorionicity/Amnionicity:  Dichorionic-diamniotic (thick membrane) TWIN 1 Yolk sac:  Yes Embryo:  Yes Cardiac Activity: No Heart Rate: n/a bpm CRL:  3.9 mm   6 w 0 d                  US EDC: 06/18/2021 TWIN 2 Yolk sac:  Yes Embryo:  Yes Cardiac Activity: No Heart Rate: N/a bpm CRL:  3.2 mm   5 w 6 d                  US EDC: 06/19/2021 Subchorionic hemorrhage:  None visualized. Maternal uterus/adnexae: Right ovary: Normal Left ovary: Normal Other :None Free fluid:  None IMPRESSION: 1. Twin gestation noted. There are 2 gestational sacs each containing a yolk sac and embryo. No cardiac activity associated within either embryo. Findings are suspicious but not yet definitive for failed twin pregnancies. Recommend follow-up US in 10-14 days for definitive diagnosis. This recommendation follows SRU consensus guidelines: Diagnostic Criteria for Nonviable Pregnancy Early in the First Trimester. Malva Limes Engl J Med 2013; 161:0960-45; 369:1443-51. Electronically Signed: By: Signa Kellaylor  Stroud M.D. On: 10/23/2020 14:23   US OB LESS THAN 14 WEEKS WITH OB TRANSVAGINAL  Result Date: 10/11/2020 CLINICAL DATA:  Vaginal bleeding. EXAM: TWIN OBSTETRIC <14WK US AND TRANSVAGINAL OB US TECHNIQUE: Both transabdominal and transvaginal ultrasound examinations were performed for complete evaluation of the gestation as well as the maternal uterus, adnexal regions, and pelvic cul-de-sac. Transvaginal technique was performed to assess early pregnancy. COMPARISON:  None. FINDINGS: Number of IUPs:  2 Chorionicity/Amnionicity:  Dichorionic-diamniotic (thick membrane) TWIN 1 Yolk sac:  Visualized. Embryo:  Visualized. Cardiac Activity: Not Visualized. CRL:  3.2 mm   5 w 6 d                  US EDC: June 07, 2021. TWIN 2 Yolk sac:  Visualized. Embryo:  Not Visualized. Cardiac Activity: Not  Visualized. MSD: 16.8 mm   6 w   3 d Subchorionic hemorrhage:  None visualized. Maternal uterus/adnexae: Ovaries are unremarkable. No free fluid is noted. IMPRESSION: Twin gestation is noted. One twin demonstrates yolk sac and fetal pole, but no fetal cardiac activity is noted. The other twin gestation demonstrates only a gestational sac, without yolk sac or embryo. Recommend follow-up quantitative B-HCG levels and follow-up US in 14 days to assess viability. This recommendation follows SRU consensus guidelines: Diagnostic Criteria for Nonviable Pregnancy Early in the First Trimester. Malva Limes Engl J Med 2013; 409:8119-14; 369:1443-51. Electronically Signed   By: Lupita RaiderJames  Green Jr M.D.   On: 10/11/2020 21:12    MAU Course  Procedures  MDM -previous dx SAB -CBC: WNL for pregnancy -hCG: 8,016 (was 30,423 on 10/11/2020) -US: On the initial scan twin gestation 1 had an embryo without cardiac activity. On today's exam absent cardiac activity with twin 1 embryo is again documented compatible with definite failed pregnancy. On the initial scan twin gestation 2 had no embryo. On today's exam there is an embryo with a crown-rump length of 3.2 mm without cardiac activity consistent with suspicious for failed pregnancy.  -consulted with Dr. Debroah LoopArnold regarding [place]  report stating Twin B is suspicious for failed pregnancy (as opposed to confirmed) with requested amendment from radiology in setting of drop in hCG from 30,000 --> 8,000. MAU provider asked if this would be considered a failed pregnancy based on [place] and significant drop in hCG. Per Dr. Debroah Loop, will go with recommendations from radiology and repeat US in 7-10 days.  -pt with almost no bleeding while in MAU, VSS, normal hgb, speculum exam deferred -pt discharged to home in stable condition  Orders Placed This Encounter  Procedures   US OB LESS THAN 14 WEEKS WITH OB TRANSVAGINAL    Standing Status:   Standing    Number of Occurrences:   1    Order Specific Question:    Symptom/Reason for Exam    Answer:   Vaginal bleeding in pregnancy [705036]   US OB Comp AddL Gest Less 14 Wks    Standing Status:   Standing    Number of Occurrences:   1    Order Specific Question:   Symptom/Reason for Exam    Answer:   Vaginal bleeding in pregnancy [705036]   [place] MFM OB DETAIL +14 WK    Standing Status:   Future    Standing Expiration Date:   10/23/2021    Order Specific Question:   Reason for Exam (SYMPTOM  OR DIAGNOSIS REQUIRED)    Answer:   viability, Twin B    Order Specific Question:   Preferred Location    Answer:   [facility]   CBC with Differential/Platelet    Standing Status:   Standing    Number of Occurrences:   1   hCG, quantitative, pregnancy    Standing Status:   Standing    Number of Occurrences:   1   Discharge patient    Order Specific Question:   Discharge disposition    Answer:   01-Home or Self Care [1]    Order Specific Question:   Discharge patient date    Answer:   10/23/2020   No orders of the defined types were placed in this encounter.  Assessment and Plan   1. Pregnancy with uncertain fetal viability, fetus 1 of multiple gestation   2. Vaginal bleeding in pregnancy   3. Blood type, Rh positive    Allergies as of 10/23/2020   No Known Allergies      Medication List     TAKE these medications    EPINEPHrine 0.3 mg/0.3 mL Soaj injection Commonly known as: EpiPen 2-Pak Inject 0.3 mLs (0.3 mg total) into the muscle once.   Prenatal Multivit-Min-Fe-FA 0.8 MG Tabs Take 1 tablet by mouth daily. Reported on 07/10/2015       -repeat US ordered in 7 weeks -return MAU precautions given -pt discharged to home in stable condition  Odie Sera Skylor Hughson 10/23/2020, 4:24 PM

## 2020-10-23 NOTE — MAU Provider Note (Addendum)
History     CSN: [id number]  Arrival date and time: 10/23/20 1001   None    Chief Complaint  Patient presents with   Back Pain   Abdominal Pain   Vaginal Bleeding   HPI  Danielle Weiss is a 35 year old female G3P2 at [redacted]w[redacted]d who presents to MAU today with vaginal bleeding, left lower back pain, and left lower abdominal pain that has become progressively worse since she was initially seen for vaginal bleeding on June 30th. This morning, patient reports changing her peri pad 3-4 times due to golf ball sized clots that she first noticed this morning. Until today, bleeding had been minimal for the past 2 weeks. Had one episode of lightheadedness while in a sitting position that completely resolved with rest 3 days ago on July 9th. Also states she has recently felt like she "can't catch her breath" since the vaginal bleeding began on June 30th. Denies chest pain and heart palpitations. She was seen in MAU on July 3rd and diagnosed with missed abortion by [facility] and instructed to follow-up in the clinic in 2 weeks.Intermittent left lower back pain that radiates to left lower abdomen started at the same time as vaginal bleeding on June 30th. Describes back pain as intermittent and currently 5/10 on the pain scale. Left lower abdominal pain 6-7 out of 10 at this time and states it feels similar to menstrual cramps. Denies aggravating factors for back pain. Reports abdominal pain is worse when actively passing clots, better once clots have passed. Denies dizziness, nausea, vomiting, and headache.    OB History     Gravida  3   Para  2   Term      Preterm      AB      Living         SAB      IAB      Ectopic      Multiple      Live Births  2           Past Medical History:  Diagnosis Date   Medical history non-contributory     Past Surgical History:  Procedure Laterality Date   NO PAST SURGERIES      Family History  Problem Relation Age of Onset   Hypertension Mother     Allergic rhinitis Neg Hx    Angioedema Neg Hx    Asthma Neg Hx    Atopy Neg Hx    Eczema Neg Hx    Immunodeficiency Neg Hx    Urticaria Neg Hx     Social History   Tobacco Use   Smoking status: Never   Smokeless tobacco: Never  Substance Use Topics   Alcohol use: No    Alcohol/week: 0.0 standard drinks   Drug use: No    Allergies: No Known Allergies  Medications Prior to Admission  Medication Sig Dispense Refill Last Dose   EPINEPHrine (EPIPEN 2-PAK) 0.3 mg/0.3 mL IJ SOAJ injection Inject 0.3 mLs (0.3 mg total) into the muscle once. 1 Device 1    Prenatal Multivit-Min-Fe-FA 0.8 MG TABS Take 1 tablet by mouth daily. Reported on 07/10/2015       Review of Systems  Constitutional:  Negative for chills, fatigue and fever.  Respiratory:  Positive for shortness of breath. Negative for cough, chest tightness and wheezing.   Cardiovascular:  Negative for chest pain and palpitations.  Gastrointestinal:  Positive for abdominal pain. Negative for diarrhea, nausea and vomiting.  Genitourinary:  Positive for vaginal bleeding. Negative for frequency, urgency and vaginal discharge.  Musculoskeletal:  Positive for back pain.  Skin:  Negative for color change and pallor.  Neurological:  Positive for light-headedness. Negative for dizziness, weakness and headaches.  Physical Exam   Blood pressure 115/81, pulse 87, temperature 98.4 F (36.9 C), temperature source Oral, resp. rate 16, weight 48.3 kg, last menstrual period 07/19/2020, SpO2 100 %.  Physical Exam Constitutional:      Appearance: She is well-developed and normal weight. She is not ill-appearing.  Cardiovascular:     Rate and Rhythm: Normal rate and regular rhythm.     Heart sounds: Normal heart sounds.  Pulmonary:     Effort: Pulmonary effort is normal.     Breath sounds: Normal breath sounds.  Abdominal:     General: Bowel sounds are normal.     Palpations: Abdomen is soft.     Tenderness: There is abdominal tenderness  in the left upper quadrant and left lower quadrant.  Genitourinary:    Vagina: Bleeding present.     Comments: Minimal bleeding present on peri pad at time of assessment.  Skin:    General: Skin is warm and dry.  Neurological:     General: No focal deficit present.     Mental Status: She is alert.  Psychiatric:        Mood and Affect: Mood normal.        Behavior: Behavior normal.    MAU Course  Procedures  Results for orders placed or performed during the hospital encounter of 10/23/20 (from the past 24 hour(s))  CBC with Differential/Platelet     Status: Abnormal   Collection Time: 10/23/20 12:20 PM  Result Value Ref Range   WBC 8.6 4.0 - 10.5 K/uL   RBC 4.10 3.87 - 5.11 MIL/uL   Hemoglobin 11.2 (L) 12.0 - 15.0 g/dL   HCT 74.1 (L) 63.8 - 45.3 %   MCV 81.0 80.0 - 100.0 fL   MCH 27.3 26.0 - 34.0 pg   MCHC 33.7 30.0 - 36.0 g/dL   RDW 64.6 80.3 - 21.2 %   Platelets 346 150 - 400 K/uL   nRBC 0.0 0.0 - 0.2 %   Neutrophils Relative % 76 %   Neutro Abs 6.6 1.7 - 7.7 K/uL   Lymphocytes Relative 18 %   Lymphs Abs 1.5 0.7 - 4.0 K/uL   Monocytes Relative 5 %   Monocytes Absolute 0.4 0.1 - 1.0 K/uL   Eosinophils Relative 1 %   Eosinophils Absolute 0.1 0.0 - 0.5 K/uL   Basophils Relative 0 %   Basophils Absolute 0.0 0.0 - 0.1 K/uL   Immature Granulocytes 0 %   Abs Immature Granulocytes 0.02 0.00 - 0.07 K/uL  hCG, quantitative, pregnancy     Status: Abnormal   Collection Time: 10/23/20 12:21 PM  Result Value Ref Range   hCG, Beta Chain, Quant, S 8,016 (H) <5 mIU/mL      MDM  [place] Results:  ADDENDUM: The following is a comparison with the original scan from 10/11/2020:   On the initial scan twin gestation 1 had an embryo without cardiac activity. On today's exam absent cardiac activity with twin 1 embryo is again documented compatible with definite failed pregnancy.   On the initial scan twin gestation 2 had no embryo. On today's exam there is an embryo with a crown-rump  length of 3.2 mm without cardiac activity consistent with suspicious for failed pregnancy.  Consulted Dr. Debroah Loop- recommended repeat  US in 10 days.   Assessment and Plan   Pregnancy with uncertain fetal viability, fetus 1 of multiple gestation - Plan: Discharge patient, [place] MFM OB DETAIL +14 WK  Vaginal bleeding in pregnancy - Plan: US OB LESS THAN 14 WEEKS WITH OB TRANSVAGINAL, US OB LESS THAN 14 WEEKS WITH OB TRANSVAGINAL, US OB Comp AddL Gest Less 14 Wks, US OB Comp AddL Gest Less 14 Wks, Discharge patient  Blood type, Rh positive - Plan: Discharge patient   Discharge home Discussed reasons to return to MAU for further evaluation and treatment. No medications indicated at this time. Instructed to follow-up for Ultrasound in 10 days per Dr. Debroah Loop recommendation.    Thurston Pounds 10/23/2020, 10:33 AM    I supervised the student during this patient encounter. Please see provider note for complete documentation.   Marylen Ponto, NP 10/23/2020 6:21 PM

## 2020-10-26 ENCOUNTER — Telehealth: Payer: Self-pay | Admitting: Family Medicine

## 2020-10-26 DIAGNOSIS — O3680X1 Pregnancy with inconclusive fetal viability, fetus 1: Secondary | ICD-10-CM

## 2020-10-26 DIAGNOSIS — O4690 Antepartum hemorrhage, unspecified, unspecified trimester: Secondary | ICD-10-CM

## 2020-10-26 NOTE — Telephone Encounter (Signed)
Patient have some questions about getting an ultra sound.

## 2020-10-29 ENCOUNTER — Other Ambulatory Visit: Payer: Self-pay | Admitting: Women's Health

## 2020-10-29 DIAGNOSIS — O3680X1 Pregnancy with inconclusive fetal viability, fetus 1: Secondary | ICD-10-CM

## 2020-10-29 DIAGNOSIS — O4690 Antepartum hemorrhage, unspecified, unspecified trimester: Secondary | ICD-10-CM

## 2020-10-29 NOTE — Telephone Encounter (Signed)
Call placed back to pt with [facility] [phone]. Pt refused interpreter. States speaks [place] well.   Spoke with pt. Pt states wanting to know when she is scheduled for follow up for [place]  from MAU 10/23/20. Per reviewed notes, Pt to have repeat US 10-14 days for definitive diagnosis. Pt states only having small vaginal bleeding and mild cramps. Denies heavy bleeding or severe abd pain. Ectopic precautions given.   [place] scheduled for 10/30/20 @ 0930 am. Pt agreeable to date and time of appt.   Judeth Cornfield, RN

## 2020-10-30 ENCOUNTER — Ambulatory Visit: Payer: BLUE CROSS/BLUE SHIELD | Attending: Women's Health

## 2020-10-30 ENCOUNTER — Ambulatory Visit (INDEPENDENT_AMBULATORY_CARE_PROVIDER_SITE_OTHER): Payer: BLUE CROSS/BLUE SHIELD | Admitting: Family Medicine

## 2020-10-30 ENCOUNTER — Encounter: Payer: Self-pay | Admitting: *Deleted

## 2020-10-30 ENCOUNTER — Other Ambulatory Visit: Payer: Self-pay

## 2020-10-30 ENCOUNTER — Other Ambulatory Visit: Payer: Self-pay | Admitting: Women's Health

## 2020-10-30 ENCOUNTER — Encounter: Payer: Self-pay | Admitting: Family Medicine

## 2020-10-30 ENCOUNTER — Ambulatory Visit: Payer: BLUE CROSS/BLUE SHIELD | Admitting: *Deleted

## 2020-10-30 VITALS — BP 116/80 | HR 77 | Ht 60.0 in | Wt 105.8 lb

## 2020-10-30 VITALS — BP 113/75 | HR 80

## 2020-10-30 DIAGNOSIS — O3680X0 Pregnancy with inconclusive fetal viability, not applicable or unspecified: Secondary | ICD-10-CM | POA: Insufficient documentation

## 2020-10-30 DIAGNOSIS — O4690 Antepartum hemorrhage, unspecified, unspecified trimester: Secondary | ICD-10-CM | POA: Insufficient documentation

## 2020-10-30 DIAGNOSIS — O039 Complete or unspecified spontaneous abortion without complication: Secondary | ICD-10-CM

## 2020-10-30 DIAGNOSIS — O09522 Supervision of elderly multigravida, second trimester: Secondary | ICD-10-CM | POA: Diagnosis present

## 2020-10-30 DIAGNOSIS — O26892 Other specified pregnancy related conditions, second trimester: Secondary | ICD-10-CM

## 2020-10-30 DIAGNOSIS — Z3A14 14 weeks gestation of pregnancy: Secondary | ICD-10-CM | POA: Diagnosis not present

## 2020-10-30 DIAGNOSIS — O3680X1 Pregnancy with inconclusive fetal viability, fetus 1: Secondary | ICD-10-CM | POA: Diagnosis present

## 2020-10-30 DIAGNOSIS — N898 Other specified noninflammatory disorders of vagina: Secondary | ICD-10-CM

## 2020-10-30 DIAGNOSIS — O30042 Twin pregnancy, dichorionic/diamniotic, second trimester: Secondary | ICD-10-CM

## 2020-10-30 MED ORDER — MISOPROSTOL 200 MCG PO TABS: 200.0000 ug | ORAL_TABLET | Freq: Once | ORAL | 0 refills | Status: DC

## 2020-10-30 MED ORDER — MEDROXYPROGESTERONE ACETATE 150 MG/ML IM SUSP: 150.0000 mg | Freq: Once | INTRAMUSCULAR | Status: DC

## 2020-10-30 NOTE — Progress Notes (Signed)
GYNECOLOGY OFFICE VISIT NOTE  History:   Danielle Weiss Danielle Weiss is a 35 y.o. G3P2 here today for miscarriage follow up.  Has had multiple US recently for likely twin gestation Repeat today showed non-viable pregnancy  Reports she has been having some vaginal bleeding like the end of a period Having some mild cramping No dizziness or lightheadedness Does not want to do expectant management Would like depo for contraception  Health Maintenance Due  Topic Date Due   COVID-19 Vaccine (1) Never done   Hepatitis C Screening  Never done   TETANUS/TDAP  Never done   PAP SMEAR-Modifier  11/26/2012    Past Medical History:  Diagnosis Date   Medical history non-contributory     Past Surgical History:  Procedure Laterality Date   NO PAST SURGERIES      The following portions of the patient's history were reviewed and updated as appropriate: allergies, current medications, past family history, past medical history, past social history, past surgical history and problem list.   Health Maintenance:   Last pap: No results found for: DIAGPAP, HPV, HPVHIGH Reports normal pap last year with PCP, records not available  Last mammogram:  N/a    Review of Systems:  Pertinent items noted in HPI and remainder of comprehensive ROS otherwise negative.  Physical Exam:  BP 116/80   Pulse 77   Ht 5' (1.524 m)   Wt 105 lb 12.8 oz (48 kg)   LMP  (LMP Unknown)   Breastfeeding Unknown   BMI 20.66 kg/m  CONSTITUTIONAL: Well-developed, well-nourished female in no acute distress.  HEENT:  Normocephalic, atraumatic. External right and left ear normal. No scleral icterus.  NECK: Normal range of motion, supple, no masses noted on observation SKIN: No rash noted. Not diaphoretic. No erythema. No pallor. MUSCULOSKELETAL: Normal range of motion. No edema noted. NEUROLOGIC: Alert and oriented to person, place, and time. Normal muscle tone coordination.  PSYCHIATRIC: Normal mood and affect. Normal behavior.  Normal judgment and thought content. RESPIRATORY: Effort normal, no problems with respiration noted   Labs and Imaging Results for orders placed or performed during the hospital encounter of 10/23/20 (from the past 168 hour(s))  CBC with Differential/Platelet   Collection Time: 10/23/20 12:20 PM  Result Value Ref Range   WBC 8.6 4.0 - 10.5 K/uL   RBC 4.10 3.87 - 5.11 MIL/uL   Hemoglobin 11.2 (L) 12.0 - 15.0 g/dL   HCT 16.133.2 (L) 09.636.0 - 04.546.0 %   MCV 81.0 80.0 - 100.0 fL   MCH 27.3 26.0 - 34.0 pg   MCHC 33.7 30.0 - 36.0 g/dL   RDW 40.913.0 81.111.5 - 91.415.5 %   Platelets 346 150 - 400 K/uL   nRBC 0.0 0.0 - 0.2 %   Neutrophils Relative % 76 %   Neutro Abs 6.6 1.7 - 7.7 K/uL   Lymphocytes Relative 18 %   Lymphs Abs 1.5 0.7 - 4.0 K/uL   Monocytes Relative 5 %   Monocytes Absolute 0.4 0.1 - 1.0 K/uL   Eosinophils Relative 1 %   Eosinophils Absolute 0.1 0.0 - 0.5 K/uL   Basophils Relative 0 %   Basophils Absolute 0.0 0.0 - 0.1 K/uL   Immature Granulocytes 0 %   Abs Immature Granulocytes 0.02 0.00 - 0.07 K/uL  hCG, quantitative, pregnancy   Collection Time: 10/23/20 12:21 PM  Result Value Ref Range   hCG, Beta Chain, Quant, S 8,016 (H) <5 mIU/mL   US OB Comp AddL Gest Less 14 Wks  Addendum Date: 10/23/2020   ADDENDUM REPORT: 10/23/2020 15:29 ADDENDUM: The following is a comparison with the original scan from 10/11/2020: On the initial scan twin gestation 1 had an embryo without cardiac activity. On today's exam absent cardiac activity with twin 1 embryo is again documented compatible with definite failed pregnancy. On the initial scan twin gestation 2 had no embryo. On today's exam there is an embryo with a crown-rump length of 3.2 mm without cardiac activity consistent with suspicious for failed pregnancy. Electronically Signed   By: Signa Kell M.D.   On: 10/23/2020 15:29   Result Date: 10/23/2020 CLINICAL DATA:  Vaginal bleeding, abdominal pain and back pain. EXAM: TWIN OBSTETRIC <14WK [place]  AND TRANSVAGINAL OB US TECHNIQUE: Both transabdominal and transvaginal ultrasound examinations were performed for complete evaluation of the gestation as well as the maternal uterus, adnexal regions, and pelvic cul-de-sac. Transvaginal technique was performed to assess early pregnancy. COMPARISON:  None. FINDINGS: Number of IUPs:  2 Chorionicity/Amnionicity:  Dichorionic-diamniotic (thick membrane) TWIN 1 Yolk sac:  Yes Embryo:  Yes Cardiac Activity: No Heart Rate: n/a bpm CRL:  3.9 mm   6 w 0 d                  [place] EDC: 06/18/2021 TWIN 2 Yolk sac:  Yes Embryo:  Yes Cardiac Activity: No Heart Rate: N/a bpm CRL:  3.2 mm   5 w 6 d                  [place] EDC: 06/19/2021 Subchorionic hemorrhage:  None visualized. Maternal uterus/adnexae: Right ovary: Normal Left ovary: Normal Other :None Free fluid:  None IMPRESSION: 1. Twin gestation noted. There are 2 gestational sacs each containing a yolk sac and embryo. No cardiac activity associated within either embryo. Findings are suspicious but not yet definitive for failed twin pregnancies. Recommend follow-up US in 10-14 days for definitive diagnosis. This recommendation follows SRU consensus guidelines: Diagnostic Criteria for Nonviable Pregnancy Early in the First Trimester. Malva Limes Med 2013; 170:0174-94. Electronically Signed: By: Signa Kell M.D. On: 10/23/2020 14:23   US OB Comp AddL Gest Less 14 Wks  Result Date: 10/11/2020 CLINICAL DATA:  Vaginal bleeding. EXAM: TWIN OBSTETRIC <14WK [place] AND TRANSVAGINAL OB US TECHNIQUE: Both transabdominal and transvaginal ultrasound examinations were performed for complete evaluation of the gestation as well as the maternal uterus, adnexal regions, and pelvic cul-de-sac. Transvaginal technique was performed to assess early pregnancy. COMPARISON:  None. FINDINGS: Number of IUPs:  2 Chorionicity/Amnionicity:  Dichorionic-diamniotic (thick membrane) TWIN 1 Yolk sac:  Visualized. Embryo:  Visualized. Cardiac Activity: Not Visualized.  CRL:  3.2 mm   5 w 6 d                  [place] EDC: June 07, 2021. TWIN 2 Yolk sac:  Visualized. Embryo:  Not Visualized. Cardiac Activity: Not Visualized. MSD: 16.8 mm   6 w   3 d Subchorionic hemorrhage:  None visualized. Maternal uterus/adnexae: Ovaries are unremarkable. No free fluid is noted. IMPRESSION: Twin gestation is noted. One twin demonstrates yolk sac and fetal pole, but no fetal cardiac activity is noted. The other twin gestation demonstrates only a gestational sac, without yolk sac or embryo. Recommend follow-up quantitative B-HCG levels and follow-up US in 14 days to assess viability. This recommendation follows SRU consensus guidelines: Diagnostic Criteria for Nonviable Pregnancy Early in the First Trimester. Malva Limes Med 2013; 496:7591-63. Electronically Signed   By: Lupita Raider  M.D.   On: 10/11/2020 21:12   [place] MFM OB TRANSVAGINAL  Result Date: 10/30/2020 ----------------------------------------------------------------------  OBSTETRICS REPORT                       (Signed Final 10/30/2020 10:59 am) ---------------------------------------------------------------------- Patient Info  ID #:       [id number]                          D.O.B.:  04/09/1986 (35 yrs)  Name:       [facility]                      Visit Date: 10/30/2020 09:54 am ---------------------------------------------------------------------- Performed By  Attending:        Noralee Space MD        Referred By:      Marylen Ponto  Performed By:     Fayne Norrie BS,      [facility]                    RD[facility], RVT                                [facility] ---------------------------------------------------------------------- Orders  #  Description                           Code        Ordered By  1  [place] MFM OB TRANSVAGINAL                [phone]     NICOLE NUGENT  ----------------------------------------------------------------------  #  Order #                     Accession #                Episode #  1  [id number]                   [id number]                 [id number] ---------------------------------------------------------------------- Indications  Twin pregnancy, di/di, second trimester        O30.042  [redacted] weeks gestation of pregnancy                Z3A.14  Vaginal discharge during pregnancy in          O26.892  second trimester ---------------------------------------------------------------------- Fetal Evaluation (Fetus A)  Num Of Fetuses:         2  Cardiac Activity:  Not visualized  Comment:    [facility] left lateral to gestational sacs, measures 1.48 cm sag x .5              cm AP x .9 cm coronal.  Patient reported passing aclot in the              bathroom in MFM. ---------------------------------------------------------------------- Biometry (Fetus A)  GS:       15.6  mm     G. Age:  6w 4d                   EDD:   06/21/21 ---------------------------------------------------------------------- OB History  Gravidity:    3         Term:   2  Living:       2 ---------------------------------------------------------------------- Gestational Age (Fetus A)  LMP:           14w 5d        Date:  07/19/20                 EDD:   04/25/21  Best:          14w 5d     Det. By:  LMP  (07/19/20)          EDD:   04/25/21 ---------------------------------------------------------------------- Fetal Evaluation (Fetus B)  Num Of Fetuses:         2  Cardiac Activity:       Not visualized ---------------------------------------------------------------------- Biometry (Fetus B)  GS:       11.6  mm     G. Age:  6w 0d                   EDD:   06/25/21 ---------------------------------------------------------------------- Gestational Age (Fetus B)  LMP:           14w 5d        Date:  07/19/20                 EDD:   04/25/21  Best:          14w 5d     Det. By:  LMP  (07/19/20)          EDD:    04/25/21 ---------------------------------------------------------------------- Cervix Uterus Adnexa  Cervix  Normal appearance by transvaginal scan  Uterus  Reflexed uterus; no other abnormalities noted  Right Ovary  Within normal limits.  Left Ovary  Within normal limits.  Cul De Sac  No free fluid seen.  Adnexa  No abnormality visualized. ---------------------------------------------------------------------- Impression  Dichorionic-diamniotic twin pregnancy.  Patient is here for  ultrasound evaluation.  On previous scans, no fetal heart activities were seen.  First  ultrasound performed on 10/11/2020 showed a dichorionic-  diamniotic twin gestation with 2 empty gestational sacs.  Subsequently, she was evaluated the MAU for complaints of  vaginal bleeding.  On ultrasound performed on 10/23/2020 absent cardiac  activities were seen in both twins.  CRL measurement was  obtained in twin B.  Today, the patient complained of vaginal bleeding and  apparently passed a clot.  We performed transvaginal ultrasound to evaluate the  pregnancy.  We confirmed 2 gestational sacs consistent with dichorionic-  diamniotic twin pregnancy.  Empty gestational sacs with no  embryos were seen.  Impression: Early embryonic loss.  I explained the findings in relation to multiple ultrasound scan  she had before and counseled her that she does not have a  viable pregnancy.  If she experiences increased vaginal bleeding, she  should  have a D&C procedure.  Alternatively, Cytotec may be given.  [facility]  I encouraged her to continue prenatal vitamins that reduces  the likelihood of open spina bifida and the fetus in future  pregnancies. ----------------------------------------------------------------------                  Noralee Space, MD Electronically Signed Final Report   10/30/2020 10:59 am ----------------------------------------------------------------------  US OB LESS  THAN 14 WEEKS WITH OB TRANSVAGINAL  Addendum Date: 10/23/2020   ADDENDUM REPORT: 10/23/2020 15:29 ADDENDUM: The following is a comparison with the original scan from 10/11/2020: On the initial scan twin gestation 1 had an embryo without cardiac activity. On today's exam absent cardiac activity with twin 1 embryo is again documented compatible with definite failed pregnancy. On the initial scan twin gestation 2 had no embryo. On today's exam there is an embryo with a crown-rump length of 3.2 mm without cardiac activity consistent with suspicious for failed pregnancy. Electronically Signed   By: Signa Kell M.D.   On: 10/23/2020 15:29   Result Date: 10/23/2020 CLINICAL DATA:  Vaginal bleeding, abdominal pain and back pain. EXAM: TWIN OBSTETRIC <14WK [place] AND TRANSVAGINAL OB US TECHNIQUE: Both transabdominal and transvaginal ultrasound examinations were performed for complete evaluation of the gestation as well as the maternal uterus, adnexal regions, and pelvic cul-de-sac. Transvaginal technique was performed to assess early pregnancy. COMPARISON:  None. FINDINGS: Number of IUPs:  2 Chorionicity/Amnionicity:  Dichorionic-diamniotic (thick membrane) TWIN 1 Yolk sac:  Yes Embryo:  Yes Cardiac Activity: No Heart Rate: n/a bpm CRL:  3.9 mm   6 w 0 d                  [place] EDC: 06/18/2021 TWIN 2 Yolk sac:  Yes Embryo:  Yes Cardiac Activity: No Heart Rate: N/a bpm CRL:  3.2 mm   5 w 6 d                  [place] EDC: 06/19/2021 Subchorionic hemorrhage:  None visualized. Maternal uterus/adnexae: Right ovary: Normal Left ovary: Normal Other :None Free fluid:  None IMPRESSION: 1. Twin gestation noted. There are 2 gestational sacs each containing a yolk sac and embryo. No cardiac activity associated within either embryo. Findings are suspicious but not yet definitive for failed twin pregnancies. Recommend follow-up US in 10-14 days for definitive diagnosis. This recommendation follows SRU consensus guidelines: Diagnostic Criteria  for Nonviable Pregnancy Early in the First Trimester. Malva Limes Med 2013; 850:2774-12. Electronically Signed: By: Signa Kell M.D. On: 10/23/2020 14:23   US OB LESS THAN 14 WEEKS WITH OB TRANSVAGINAL  Result Date: 10/11/2020 CLINICAL DATA:  Vaginal bleeding. EXAM: TWIN OBSTETRIC <14WK [place] AND TRANSVAGINAL OB US TECHNIQUE: Both transabdominal and transvaginal ultrasound examinations were performed for complete evaluation of the gestation as well as the maternal uterus, adnexal regions, and pelvic cul-de-sac. Transvaginal technique was performed to assess early pregnancy. COMPARISON:  None. FINDINGS: Number of IUPs:  2 Chorionicity/Amnionicity:  Dichorionic-diamniotic (thick membrane) TWIN 1 Yolk sac:  Visualized. Embryo:  Visualized. Cardiac Activity: Not Visualized. CRL:  3.2 mm   5 w 6 d                  [place] EDC: June 07, 2021. TWIN 2 Yolk sac:  Visualized. Embryo:  Not Visualized. Cardiac Activity: Not Visualized. MSD: 16.8 mm   6 w   3 d Subchorionic  hemorrhage:  None visualized. Maternal uterus/adnexae: Ovaries are unremarkable. No free fluid is noted. IMPRESSION: Twin gestation is noted. One twin demonstrates yolk sac and fetal pole, but no fetal cardiac activity is noted. The other twin gestation demonstrates only a gestational sac, without yolk sac or embryo. Recommend follow-up quantitative B-HCG levels and follow-up US in 14 days to assess viability. This recommendation follows SRU consensus guidelines: Diagnostic Criteria for Nonviable Pregnancy Early in the First Trimester. Malva Limes Med 2013; 161:0960-45. Electronically Signed   By: Lupita Raider M.D.   On: 10/11/2020 21:12      Assessment and Plan:   Problem List Items Addressed This Visit   None Visit Diagnoses     SAB (spontaneous abortion)    -  Primary      US findings c/w failed pregnancy. Discussed expectant vs misoprostol vs D&C management, elects for misoprostol. To be given buccally, rx sent, advised on expected symptoms and  reviewed warning signs that would prompt MAU visit. Would like Depo for contraception, plans to get it at next visit.  ROI for pap result. Has follow up visit in one week, hopefully can get pap record and do Depo at that time.   Routine preventative health maintenance measures emphasized. Please refer to After Visit Summary for other counseling recommendations.   Return in about 1 week (around 11/06/2020) for Annual Wellness Visit.    Total face-to-face time with patient: 20 minutes.  Over 50% of encounter was spent on counseling and coordination of care.   Venora Maples, MD/MPH Attending Family Medicine Physician, [facility] for [facility], [facility]

## 2020-11-01 ENCOUNTER — Telehealth: Payer: Self-pay

## 2020-11-01 NOTE — Telephone Encounter (Signed)
Call placed back to pt. Spoke with pt. Pt states taking Cytotec x 1 on 7/20 at 2pm. Pt states started having heavy bleeding with clots and severe cramps from 4pm til 10:30pm. Changed pad every to 1 hour. Pt states today bleeding is better, changing pads once today so far.  Pt asking if needs to take another Cytotec ? Pt advised not to take another one at this time. Sounds like she is done with her SAB. Pt advised to take Tylenol or Motrin for cramping pain and advised this was normal symptoms after taking Cytotec Rx. Pt advised if feeling lightheaded, passing out, faint or heavy bleeding not stopping, then go to ER. Pt verbalized understanding. Pt advised to keep appt for follow up on 7/25. Pt agreeable to plan of care.   Judeth Cornfield, RN

## 2020-11-01 NOTE — Telephone Encounter (Signed)
Patient called in requesting to speak with clinic regarding her Cytotec. Please advise

## 2020-11-05 ENCOUNTER — Ambulatory Visit (INDEPENDENT_AMBULATORY_CARE_PROVIDER_SITE_OTHER): Payer: BLUE CROSS/BLUE SHIELD | Admitting: Obstetrics and Gynecology

## 2020-11-05 ENCOUNTER — Encounter: Payer: Self-pay | Admitting: Obstetrics and Gynecology

## 2020-11-05 ENCOUNTER — Other Ambulatory Visit: Payer: Self-pay

## 2020-11-05 DIAGNOSIS — O039 Complete or unspecified spontaneous abortion without complication: Secondary | ICD-10-CM | POA: Insufficient documentation

## 2020-11-05 HISTORY — DX: Complete or unspecified spontaneous abortion without complication: O03.9

## 2020-11-05 NOTE — Progress Notes (Signed)
Danielle Weiss presents for miscarriage f/u. Pt Dx with MAB @ 1 week ago. Took Cytotec x 1. Pt reports bleeding shortly after taking and it has decreased now. Occ cramp but no severe pain.  Blood type A +  PE  AF VSS Lungs clear Heart RRR Abd soft + BS  A/P F/U Miscarriage  Will check BHCG today and trend to < 5. Pt desires Depo Provera. Will start once BHCG < 5. Pt had pap smear with PCP 1-2 yrs ago, Reports as negative. Release of records signed at last visit. F/U as per BHCG results.

## 2020-11-06 LAB — BETA HCG QUANT (REF LAB): hCG Quant: 724 m[IU]/mL

## 2020-11-07 ENCOUNTER — Telehealth: Payer: Self-pay

## 2020-11-07 NOTE — Telephone Encounter (Addendum)
-----   Message from Hermina Staggers, MD sent at 11/06/2020 10:18 AM EDT ----- Please let MS Majchrzak that her BHCG is decreasing appropriately. Repeat BHCG in 2 weeks, non stat.  Thanks Casimiro Needle   Notified pt provider's recommendation for f/u non stat beta in two weeks.  Pt reports that she will be out of town and would like to come in on 11/23/20 @ 0900.    Danielle Weiss  11/07/20

## 2020-11-22 ENCOUNTER — Other Ambulatory Visit: Payer: Self-pay | Admitting: General Practice

## 2020-11-22 DIAGNOSIS — O039 Complete or unspecified spontaneous abortion without complication: Secondary | ICD-10-CM

## 2020-11-23 ENCOUNTER — Other Ambulatory Visit: Payer: Self-pay

## 2020-11-23 ENCOUNTER — Other Ambulatory Visit: Payer: BLUE CROSS/BLUE SHIELD

## 2020-11-23 DIAGNOSIS — O039 Complete or unspecified spontaneous abortion without complication: Secondary | ICD-10-CM

## 2020-11-24 LAB — BETA HCG QUANT (REF LAB): hCG Quant: 172 m[IU]/mL

## 2020-11-27 ENCOUNTER — Telehealth: Payer: Self-pay

## 2020-11-27 NOTE — Telephone Encounter (Signed)
Left message for pt. Will call back pt for results and recommendations due to no active mychart.   Judeth Cornfield, RN

## 2020-11-27 NOTE — Telephone Encounter (Signed)
-----   Message from Hermina Staggers, MD sent at 11/26/2020  4:03 PM EDT ----- Please let pt know that her BHCG is decreasing. Repeat in 2 weeks non stat. Thanks Casimiro Needle

## 2020-11-28 NOTE — Telephone Encounter (Signed)
Call placed to pt. Spoke with pt. Pt given results and recommendations per Dr Alysia Penna. Pt agreeable to plan of care. Pt scheduled for non-stat beta on 8/25 at 820am. Pt verbalized understanding to date and time of appt.  Pt also states having some vaginal bleeding that is light and changing a pad twice a day. Denies soaking pads, feeling weak, dizzy or lightheaded. Pt advised this is normal since still having hcg in her blood levels. Pt advised if has heavy bleeding, large clots, feeling like she will pass out, then go to ER. Pt verbalized understanding.   Judeth Cornfield, RN

## 2020-12-06 ENCOUNTER — Other Ambulatory Visit: Payer: Self-pay | Admitting: General Practice

## 2020-12-06 ENCOUNTER — Other Ambulatory Visit: Payer: Self-pay

## 2020-12-06 ENCOUNTER — Other Ambulatory Visit: Payer: BLUE CROSS/BLUE SHIELD

## 2020-12-06 DIAGNOSIS — O039 Complete or unspecified spontaneous abortion without complication: Secondary | ICD-10-CM

## 2020-12-07 LAB — BETA HCG QUANT (REF LAB): hCG Quant: 53 m[IU]/mL

## 2020-12-10 ENCOUNTER — Telehealth: Payer: Self-pay

## 2020-12-10 NOTE — Telephone Encounter (Signed)
Call placed to pt. Spoke with pt. Pt given results and recommendations per Dr Alysia Penna. Pt verbalized understanding. Pt agreeable to plan of care.  Pt scheduled for Non-stat Beta on 9/8 at 10am. Pt agreeable to date and time of appt.  Judeth Cornfield, RN

## 2020-12-10 NOTE — Telephone Encounter (Signed)
-----   Message from Hermina Staggers, MD sent at 12/07/2020  7:38 AM EDT ----- Repeat BHCG in 2 weeks, non stat Thanks Casimiro Needle

## 2020-12-20 ENCOUNTER — Other Ambulatory Visit: Payer: BLUE CROSS/BLUE SHIELD

## 2020-12-20 ENCOUNTER — Other Ambulatory Visit: Payer: Self-pay | Admitting: General Practice

## 2020-12-20 ENCOUNTER — Other Ambulatory Visit: Payer: Self-pay

## 2020-12-20 DIAGNOSIS — O039 Complete or unspecified spontaneous abortion without complication: Secondary | ICD-10-CM

## 2020-12-25 ENCOUNTER — Other Ambulatory Visit: Payer: Self-pay

## 2020-12-25 ENCOUNTER — Other Ambulatory Visit: Payer: BLUE CROSS/BLUE SHIELD

## 2020-12-25 DIAGNOSIS — O039 Complete or unspecified spontaneous abortion without complication: Secondary | ICD-10-CM

## 2020-12-26 LAB — BETA HCG QUANT (REF LAB): hCG Quant: <1 m[IU]/mL

## 2022-03-05 ENCOUNTER — Ambulatory Visit
Admission: EM | Admit: 2022-03-05 | Discharge: 2022-03-05 | Disposition: A | Discharge status: Home / Self Care | Payer: 59 | Attending: Internal Medicine | Admitting: Internal Medicine

## 2022-03-05 DIAGNOSIS — R350 Frequency of micturition: Secondary | ICD-10-CM | POA: Insufficient documentation

## 2022-03-05 DIAGNOSIS — Z3202 Encounter for pregnancy test, result negative: Secondary | ICD-10-CM | POA: Diagnosis not present

## 2022-03-05 DIAGNOSIS — N3001 Acute cystitis with hematuria: Secondary | ICD-10-CM | POA: Insufficient documentation

## 2022-03-05 DIAGNOSIS — R300 Dysuria: Secondary | ICD-10-CM | POA: Diagnosis not present

## 2022-03-05 LAB — POCT URINALYSIS DIP (MANUAL ENTRY)
Bilirubin, UA: NEGATIVE
Glucose, UA: NEGATIVE mg/dL
Ketones, POC UA: NEGATIVE mg/dL
Nitrite, UA: NEGATIVE
Protein Ur, POC: 100 mg/dL — AB
Spec Grav, UA: 1.02 (ref 1.010–1.025)
Urobilinogen, UA: 1 E.U./dL
pH, UA: 6.5 (ref 5.0–8.0)

## 2022-03-05 LAB — POCT URINE PREGNANCY: Preg Test, Ur: NEGATIVE

## 2022-03-05 MED ORDER — NITROFURANTOIN MONOHYD MACRO 100 MG PO CAPS: 100.0000 mg | ORAL_CAPSULE | Freq: Two times a day (BID) | ORAL | 0 refills | Status: DC

## 2022-03-05 NOTE — ED Triage Notes (Signed)
Pt c/o dysuria, frequency, hematuria onset ~ this morning.

## 2022-03-05 NOTE — Discharge Instructions (Addendum)
You have a urinary tract infection which is being treated with an antibiotic.  Urine culture is pending.  We will call when it results.  Urine pregnancy was negative.  Please follow-up if symptoms persist or worsen.

## 2022-03-05 NOTE — ED Provider Notes (Signed)
[facility]    CSN: [id number] Arrival date & time: 03/05/22  1859      History   Chief Complaint Chief Complaint  Patient presents with   Dysuria    HPI Danielle Weiss is a 36 y.o. female.   Patient presents with hematuria, dysuria, urinary frequency that started this morning.  Denies vaginal discharge, abdominal pain, fever.  Patient does endorse some left lower back pain that started this morning as well.  Patient is not sure of last menstrual cycle.  She states that she used to get Depo injections but stopped these in May.  She has not had a menstrual cycle since stopping birth control.    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 Authorizing Provider  nitrofurantoin, macrocrystal-monohydrate, (MACROBID) 100 MG capsule Take 1 capsule (100 mg total) by mouth 2 (two) times daily. 03/05/22  Yes Ferlin Fairhurst, Rolly Salter E, FNP  EPINEPHrine (EPIPEN 2-PAK) 0.3 mg/0.3 mL IJ SOAJ injection Inject 0.3 mLs (0.3 mg total) into the muscle once. 07/10/15   Bobbitt, Heywood Iles, MD    Family History Family History  Problem Relation Age of Onset   Hypertension Mother    Allergic rhinitis Neg Hx    Angioedema Neg Hx    Asthma Neg Hx    Atopy Neg Hx    Eczema Neg Hx    Immunodeficiency Neg Hx    Urticaria Neg Hx     Social History Social History   Tobacco Use   Smoking status: Never    Smokeless tobacco: Never  Vaping Use   Vaping Use: Never used  Substance Use Topics   Alcohol use: No    Alcohol/week: 0.0 standard drinks of alcohol   Drug use: No     Allergies   Patient has no known allergies.   Review of Systems Review of Systems Per HPI  Physical Exam Triage Vital Signs ED Triage Vitals  Enc Vitals Group     BP 03/05/22 1912 131/72     Pulse Rate 03/05/22 1911 89     Resp 03/05/22 1911 16     Temp 03/05/22 1911 98 F (36.7 C)     Temp Source 03/05/22 1911 Oral     SpO2 03/05/22 1911 98 %     Weight --      Height --      Head Circumference --      Peak Flow --      Pain Score 03/05/22 1911 0     Pain Loc --      Pain Edu? --  Excl. in GC? --    No data found.  Updated Vital Signs BP 131/72 (BP Location: Left Arm)   Pulse 89   Temp 98 F (36.7 C) (Oral)   Resp 16   SpO2 98%   Breastfeeding No   Visual Acuity Right Eye Distance:   Left Eye Distance:   Bilateral Distance:    Right Eye Near:   Left Eye Near:    Bilateral Near:     Physical Exam Constitutional:      General: She is not in acute distress.    Appearance: Normal appearance. She is not toxic-appearing or diaphoretic.  HENT:     Head: Normocephalic and atraumatic.  Eyes:     Extraocular Movements: Extraocular movements intact.     Conjunctiva/sclera: Conjunctivae normal.  Cardiovascular:     Rate and Rhythm: Normal rate and regular rhythm.     Pulses: Normal pulses.     Heart sounds: Normal heart sounds.  Pulmonary:     Effort: Pulmonary effort is normal. No respiratory distress.     Breath sounds: Normal breath sounds.  Abdominal:     General: Bowel sounds are normal. There is no distension.     Palpations: Abdomen is soft.     Tenderness: There is no abdominal tenderness.  Neurological:     General: No focal deficit present.     Mental Status: She is alert and oriented to person, place, and time. Mental status is at baseline.  Psychiatric:         Mood and Affect: Mood normal.        Behavior: Behavior normal.        Thought Content: Thought content normal.        Judgment: Judgment normal.      UC Treatments / Results  Labs (all labs ordered are listed, but only abnormal results are displayed) Labs Reviewed  POCT URINALYSIS DIP (MANUAL ENTRY) - Abnormal; Notable for the following components:      Result Value   Clarity, UA cloudy (*)    Blood, UA large (*)    Protein Ur, POC =100 (*)    Leukocytes, UA Small (1+) (*)    All other components within normal limits  URINE CULTURE  POCT URINE PREGNANCY    EKG   Radiology No results found.  Procedures Procedures (including critical care time)  Medications Ordered in UC Medications - No data to display  Initial Impression / Assessment and Plan / UC Course  I have reviewed the triage vital signs and the nursing notes.  Pertinent labs & imaging results that were available during my care of the patient were reviewed by me and considered in my medical decision making (see chart for details).     Urinalysis indicating urinary tract infection with small amount of leuks.  Will treat with Macrobid antibiotic.  Urine culture pending.  Urine pregnancy negative.  Advised patient to follow-up if symptoms persist or worsen.  Patient verbalized understanding and was agreeable with plan. Final Clinical Impressions(s) / UC Diagnoses   Final diagnoses:  Acute cystitis with hematuria  Dysuria  Urinary frequency  Urine pregnancy test negative     Discharge Instructions      You have a urinary tract infection which is being treated with an antibiotic.  Urine culture is pending.  We will call when it results.  Urine pregnancy was negative.  Please follow-up if symptoms persist or worsen.    ED Prescriptions     Medication  Sig Dispense Auth. Provider   nitrofurantoin, macrocrystal-monohydrate, (MACROBID) 100 MG capsule Take 1 capsule (100 mg total) by mouth 2 (two) times  daily. 10 capsule Gustavus Bryant, [place]      PDMP not reviewed this encounter.   Gustavus Bryant, [place] 03/05/22 1927

## 2022-03-10 LAB — URINE CULTURE: Culture: 10000 — AB

## 2023-05-12 ENCOUNTER — Inpatient Hospital Stay (HOSPITAL_COMMUNITY): Payer: BLUE CROSS/BLUE SHIELD

## 2023-05-12 ENCOUNTER — Encounter (HOSPITAL_COMMUNITY): Payer: Self-pay

## 2023-05-12 ENCOUNTER — Inpatient Hospital Stay (HOSPITAL_COMMUNITY)
Admission: AD | Admit: 2023-05-12 | Discharge: 2023-05-12 | Disposition: A | Discharge status: Home / Self Care | Payer: BLUE CROSS/BLUE SHIELD | Attending: Obstetrics and Gynecology | Admitting: Obstetrics and Gynecology

## 2023-05-12 DIAGNOSIS — O26859 Spotting complicating pregnancy, unspecified trimester: Secondary | ICD-10-CM

## 2023-05-12 DIAGNOSIS — O208 Other hemorrhage in early pregnancy: Secondary | ICD-10-CM | POA: Insufficient documentation

## 2023-05-12 DIAGNOSIS — O26891 Other specified pregnancy related conditions, first trimester: Secondary | ICD-10-CM | POA: Insufficient documentation

## 2023-05-12 DIAGNOSIS — Z3A01 Less than 8 weeks gestation of pregnancy: Secondary | ICD-10-CM | POA: Diagnosis not present

## 2023-05-12 DIAGNOSIS — O09521 Supervision of elderly multigravida, first trimester: Secondary | ICD-10-CM | POA: Diagnosis not present

## 2023-05-12 DIAGNOSIS — Z3490 Encounter for supervision of normal pregnancy, unspecified, unspecified trimester: Secondary | ICD-10-CM

## 2023-05-12 LAB — HCG, QUANTITATIVE, PREGNANCY: hCG, Beta Chain, Quant, S: 182282 m[IU]/mL — ABNORMAL HIGH (ref <5)

## 2023-05-12 LAB — WET PREP, GENITAL
Clue Cells Wet Prep HPF POC: NONE SEEN
Sperm: NONE SEEN
Trich, Wet Prep: NONE SEEN
WBC, Wet Prep HPF POC: >=10 — AB (ref <10)
Yeast Wet Prep HPF POC: NONE SEEN

## 2023-05-12 LAB — URINALYSIS, ROUTINE W REFLEX MICROSCOPIC
Bilirubin Urine: NEGATIVE
Glucose, UA: NEGATIVE mg/dL
Hgb urine dipstick: NEGATIVE
Ketones, ur: 20 mg/dL — AB
Leukocytes,Ua: NEGATIVE
Nitrite: NEGATIVE
Protein, ur: NEGATIVE mg/dL
Specific Gravity, Urine: 1.026 (ref 1.005–1.030)
pH: 5 (ref 5.0–8.0)

## 2023-05-12 LAB — POCT PREGNANCY, URINE: Preg Test, Ur: POSITIVE — AB

## 2023-05-12 LAB — CBC
HCT: 36.8 % (ref 36.0–46.0)
Hemoglobin: 12.4 g/dL (ref 12.0–15.0)
MCH: 27.1 pg (ref 26.0–34.0)
MCHC: 33.7 g/dL (ref 30.0–36.0)
MCV: 80.5 fL (ref 80.0–100.0)
Platelets: 294 10*3/uL (ref 150–400)
RBC: 4.57 MIL/uL (ref 3.87–5.11)
RDW: 12.7 % (ref 11.5–15.5)
WBC: 4.3 10*3/uL (ref 4.0–10.5)
nRBC: 0 % (ref 0.0–0.2)

## 2023-05-12 NOTE — Discharge Instructions (Addendum)
Please call any of the below providers to establish care/follow up:  [facility] for [employer] [facility] [facility]             [address], [place], [place] [address] [phone]  Center for [facility]                                                             [address], [place], [place], [address] [phone]  Center for [facility] [address], [place], [place], [address] [phone]  Center for [facility] [address], [address], [place], [place], [address] [phone]  Center for [facility] at [facility]                                 [address] [place], [place], [place], [address] [phone]  Center for [facility] at [facility]                                    [address] [place], [place], [address] [phone]  Center for [facility] at [facility] [address], [address], [place], [place], [address]                              [facility] [address], [address], [place], [place], [address] [phone]  [facility]         Phone: [phone]  [facility]      Phone: [phone]   [facility]      Phone: [phone]  [facility]         Phone: [phone]   [facility]    Phone: [phone]  [facility]      Phone: [phone]  [facility]     Phone: [phone]  [facility]        Phone: [phone]  [facility]) [phone]  [facility]      Phone: [phone]    Safe Medications in Pregnancy   Acne:  Benzoyl Peroxide  Salicylic Acid   Backache/Headache:   Tylenol: 2 regular strength every 4 hours OR               2 Extra strength every 6 hours   Colds/Coughs/Allergies:  Benadryl (alcohol free) 25 mg every 6 hours as needed  Breath right strips  Claritin  Cepacol throat lozenges  Chloraseptic throat spray  Cold-Eeze- up to three times per day  Cough drops, alcohol free  Flonase (by prescription only)  Guaifenesin  Mucinex  Robitussin DM (plain only, alcohol free)  Saline nasal spray/drops  Sudafed (pseudoephedrine) & Actifed * use only after [redacted] weeks gestation and if  you do not have high blood pressure  Tylenol  Vicks Vaporub  Zinc lozenges  Zyrtec   Constipation:  Colace  Ducolax suppositories  Fleet enema  Glycerin suppositories  Metamucil  Milk of magnesia  Miralax  Senokot  Smooth move tea   Diarrhea:  Kaopectate  Imodium A-D   *NO pepto Bismol   Hemorrhoids:  Anusol  Anusol HC  Preparation H  Tucks   Indigestion:  Tums  Maalox  Mylanta  Zantac  Pepcid   Insomnia:  Benadryl (alcohol free) 25mg  every 6 hours as needed  Tylenol PM  Unisom, no Gelcaps   Leg Cramps:  Tums  MagGel   Nausea/Vomiting:  Bonine  Dramamine  Emetrol  Ginger extract  Sea bands  Meclizine  Nausea medication to take during pregnancy:  Unisom (doxylamine succinate 25 mg tablets) Take one tablet daily at bedtime. If symptoms are not adequately controlled, the dose can be increased to a maximum recommended dose of two tablets daily (1/2 tablet in the morning, 1/2 tablet mid-afternoon and one at bedtime).  Vitamin B6 100mg  tablets. Take one tablet twice a day (up to 200 mg per day).   Skin Rashes:  Aveeno products  Benadryl cream or 25mg  every 6 hours as needed  Calamine Lotion  1% cortisone cream   Yeast infection:  Gyne-lotrimin 7  Monistat 7    **If taking multiple medications, please check labels to avoid duplicating the same active ingredients  **take medication as directed on the label  ** Do not exceed  4000 mg of tylenol in 24 hours  **Do not take medications that contain aspirin or ibuprofen

## 2023-05-12 NOTE — MAU Provider Note (Signed)
Chief Complaint: Abdominal Pain and Vaginal Bleeding  SUBJECTIVE HPI: Danielle Weiss is a 38 y.o. G3P2002 at [redacted]w[redacted]d by LMP who presents to maternity admissions reporting vaginal spotting and cramping. She denies urinary symptoms, h/a, dizziness, n/v, or fever/chills.    She reports she has had cramping in her abdomen since she found out she was pregnancy with a home pregnancy test. She reports a 1 day history of vaginal spotting. Denies sharp abdominal pain, diarrhea, urinary symptoms. Has not had intercourse in >1 week. Has not yet made appointment with OB for first prenatal visit.   Abdominal Pain  Vaginal Bleeding Associated symptoms include abdominal pain.    Past Medical History:  Diagnosis Date   Medical history non-contributory    Past Surgical History:  Procedure Laterality Date   NO PAST SURGERIES     Social History   Socioeconomic History   Marital status: Single    Spouse name: Not on file   Number of children: Not on file   Years of education: Not on file   Highest education level: Not on file  Occupational History   Not on file  Tobacco Use   Smoking status: Never   Smokeless tobacco: Never  Vaping Use   Vaping status: Never Used  Substance and Sexual Activity   Alcohol use: No    Alcohol/week: 0.0 standard drinks of alcohol   Drug use: No   Sexual activity: Yes    Birth control/protection: None  Other Topics Concern   Not on file  Social History Narrative   Lives with husband and Child. Works at [employer] part time.         Social Drivers of Corporate investment banker Strain: Not on file  Food Insecurity: No Food Insecurity (11/06/2020)   Hunger Vital Sign    Worried About Running Out of Food in the Last Year: Never true    Ran Out of Food in the Last Year: Never true  Transportation Needs: No Transportation Needs (11/06/2020)   PRAPARE - Administrator, Civil Service (Medical): No    Lack of Transportation (Non-Medical): No  Physical  Activity: Not on file  Stress: Not on file  Social Connections: Not on file  Intimate Partner Violence: Not on file   No current facility-administered medications on file prior to encounter.   Current Outpatient Medications on File Prior to Encounter  Medication Sig Dispense Refill   EPINEPHrine (EPIPEN 2-PAK) 0.3 mg/0.3 mL IJ SOAJ injection Inject 0.3 mLs (0.3 mg total) into the muscle once. 1 Device 1   nitrofurantoin, macrocrystal-monohydrate, (MACROBID) 100 MG capsule Take 1 capsule (100 mg total) by mouth 2 (two) times daily. 10 capsule 0   No Known Allergies  I have reviewed patient's Past Medical Hx, Surgical Hx, Family Hx, Social Hx, medications and allergies.   ROS:  Review of Systems  Gastrointestinal:  Positive for abdominal pain.  Genitourinary:  Positive for vaginal bleeding.   Review of Systems  Other systems negative  Physical Exam  Physical Exam Patient Vitals for the past 24 hrs:  BP Temp Temp src Pulse Resp SpO2 Height Weight  05/12/23 1227 118/81 99 F (37.2 C) Oral 77 14 100 % 5' (1.524 m) 49.9 kg   Constitutional: Well-developed, well-nourished female in no acute distress.  Cardiovascular: normal rate Respiratory: normal effort GI: Abd soft, non-tender. Pos BS x 4 MS: Extremities nontender, no edema, normal ROM Neurologic: Alert and oriented x 4.  GU: Neg CVAT.  LAB  RESULTS Results for orders placed or performed during the hospital encounter of 05/12/23 (from the past 24 hours)  Pregnancy, urine POC     Status: Abnormal   Collection Time: 05/12/23 11:16 AM  Result Value Ref Range   Preg Test, Ur POSITIVE (A) NEGATIVE  Wet prep, genital     Status: Abnormal   Collection Time: 05/12/23 11:26 AM  Result Value Ref Range   Yeast Wet Prep HPF POC NONE SEEN NONE SEEN   Trich, Wet Prep NONE SEEN NONE SEEN   Clue Cells Wet Prep HPF POC NONE SEEN NONE SEEN   WBC, Wet Prep HPF POC >=10 (A) <10   Sperm NONE SEEN   Urinalysis, Routine w reflex microscopic  -Urine, Clean Catch     Status: Abnormal   Collection Time: 05/12/23 12:12 PM  Result Value Ref Range   Color, Urine YELLOW YELLOW   APPearance CLEAR CLEAR   Specific Gravity, Urine 1.026 1.005 - 1.030   pH 5.0 5.0 - 8.0   Glucose, UA NEGATIVE NEGATIVE mg/dL   Hgb urine dipstick NEGATIVE NEGATIVE   Bilirubin Urine NEGATIVE NEGATIVE   Ketones, ur 20 (A) NEGATIVE mg/dL   Protein, ur NEGATIVE NEGATIVE mg/dL   Nitrite NEGATIVE NEGATIVE   Leukocytes,Ua NEGATIVE NEGATIVE  CBC     Status: None   Collection Time: 05/12/23 12:48 PM  Result Value Ref Range   WBC 4.3 4.0 - 10.5 K/uL   RBC 4.57 3.87 - 5.11 MIL/uL   Hemoglobin 12.4 12.0 - 15.0 g/dL   HCT 16.1 09.6 - 04.5 %   MCV 80.5 80.0 - 100.0 fL   MCH 27.1 26.0 - 34.0 pg   MCHC 33.7 30.0 - 36.0 g/dL   RDW 40.9 81.1 - 91.4 %   Platelets 294 150 - 400 K/uL   nRBC 0.0 0.0 - 0.2 %      IMAGING No results found.  MAU Management/MDM: I have reviewed the triage vital signs and the nursing notes.   Pertinent labs & imaging results that were available during my care of the patient were reviewed by me and considered in my medical decision making (see chart for details).      I have reviewed her medical records including past results, notes and treatments. Medical, Surgical, and family history were reviewed.  Medications and recent lab tests were reviewed  Ordered usual first trimester r/o ectopic labs.   Cultures done Will check baseline Ultrasound to rule out ectopic.  This bleeding/pain can represent a normal pregnancy with bleeding, spontaneous abortion or even an ectopic which can be life-threatening.  The process as listed above helps to determine which of these is present.  Labs and imaging reassuring and negative for acute process including ectopic.  [place] with IUP today  ASSESSMENT 1. Intrauterine pregnancy   2. Spotting in pregnancy     PLAN Discharge home Gave names for local OB's for follow up outpatient  Pt stable at  time of discharge. Encouraged to return here if she develops worsening of symptoms, increase in pain, fever, or other concerning symptoms.   Mary Sella [facility] 05/12/2023  1:29 PM

## 2023-05-12 NOTE — MAU Note (Signed)
...  Danielle Weiss is a 38 y.o. at Unknown here in MAU reporting: Occasional lower abdominal cramps and light pink vaginal spotting. She reports she has been cramping for 1-2 weeks now but reports the spotting began yesterday. Denies recent IC. She reports vaginal itching.  LMP: 03/21/2023 Onset of complaint: 1-2 weeks Pain score: 1/10 lower abdomen  Vitals:   05/12/23 1227  BP: 118/81  Pulse: 77  Resp: 14  Temp: 99 F (37.2 C)  SpO2: 100%      FHT: n/a Lab orders placed from triage: POCT Preg, UA, Vaginal Swabs

## 2023-05-13 ENCOUNTER — Encounter: Payer: Self-pay | Admitting: Obstetrics and Gynecology

## 2023-05-13 LAB — GC/CHLAMYDIA PROBE AMP (~~LOC~~) NOT AT ARMC
Chlamydia: NEGATIVE
Comment: NEGATIVE
Comment: NORMAL
Neisseria Gonorrhea: NEGATIVE

## 2023-05-16 DIAGNOSIS — O24419 Gestational diabetes mellitus in pregnancy, unspecified control: Secondary | ICD-10-CM

## 2023-05-16 HISTORY — DX: Gestational diabetes mellitus in pregnancy, unspecified control: O24.419

## 2023-05-21 IMAGING — US US OB < 14 WEEKS - US OB TV
1 series · 15 of 28 positions shown · non-contrast
Comparison: None.

CLINICAL DATA: Vaginal bleeding.

EXAM:
TWIN OBSTETRIC <14WK US AND TRANSVAGINAL OB US
TECHNIQUE: Both transabdominal and transvaginal ultrasound examinations were
performed for complete evaluation of the gestation as well as the
maternal uterus, adnexal regions, and pelvic cul-de-sac.
Transvaginal technique was performed to assess early pregnancy.

[Series 1: us ob < 14 weeks - us ob tv · 66 acquisitions, 15 frames shown]
[im 1/66]
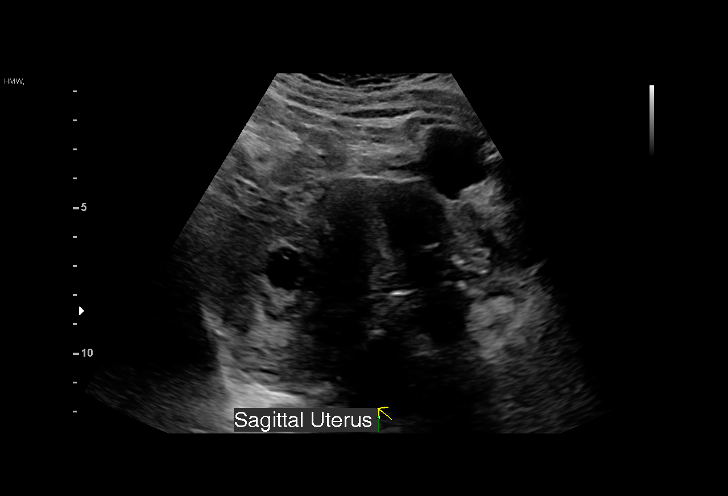
[im 5/66]
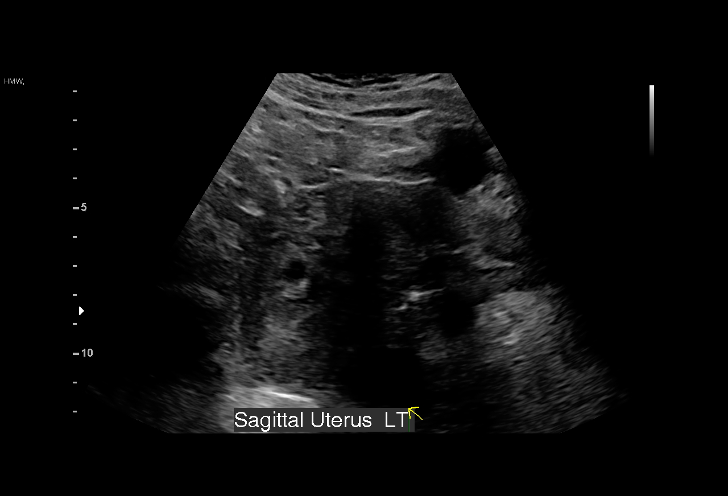
[im 10/66]
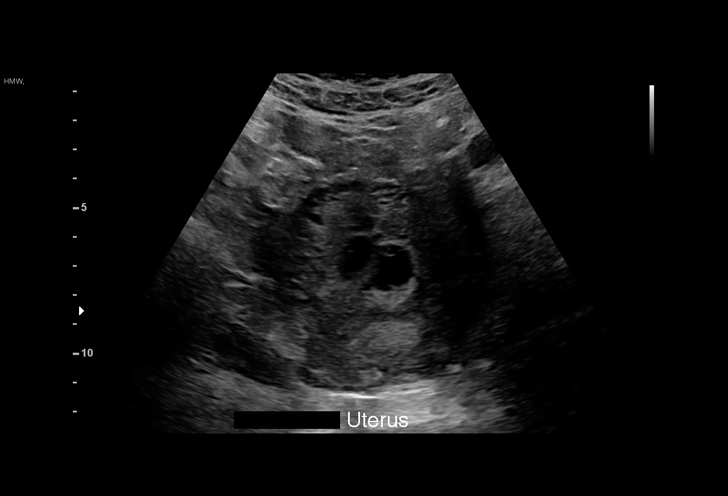
[im 15/66]
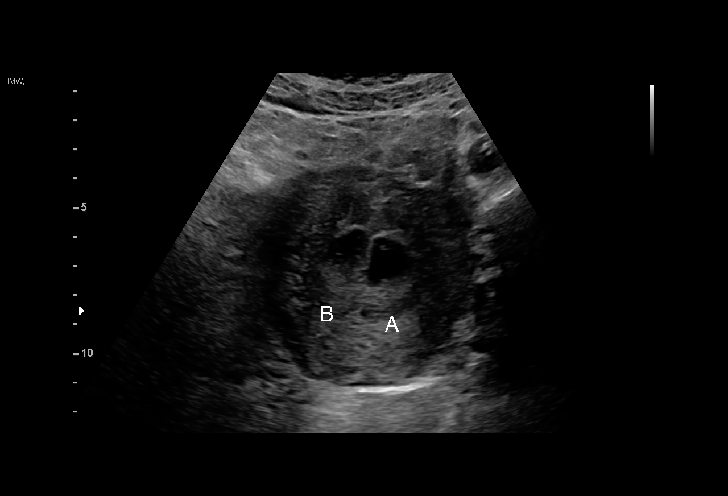
[im 20/66]
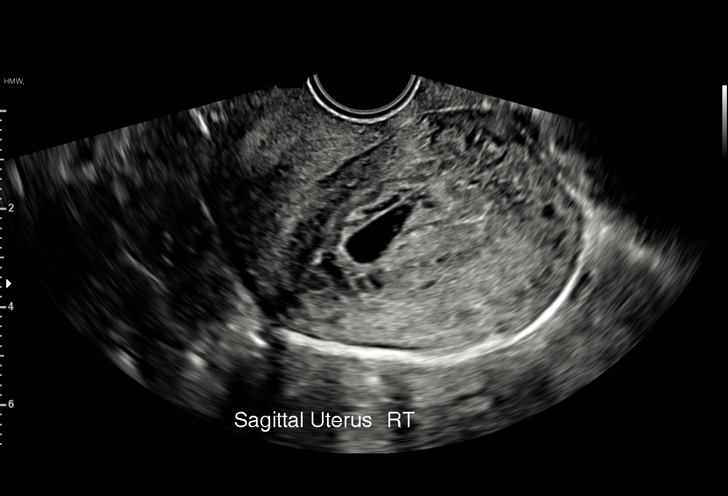
[im 25/66]
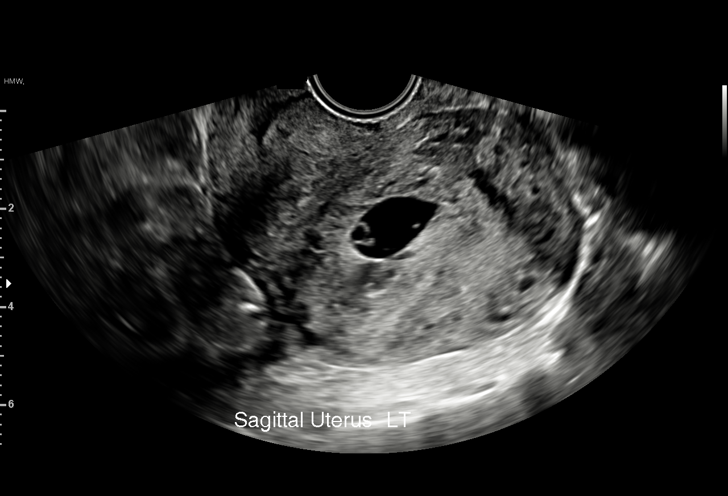
[im 29/66]
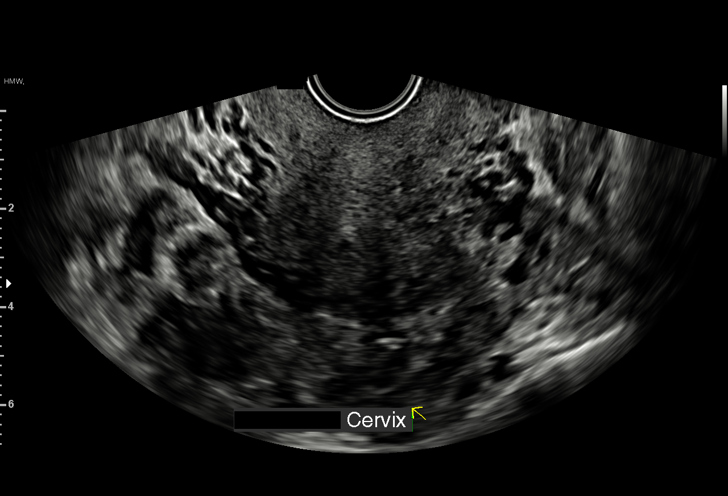
[im 34/66]
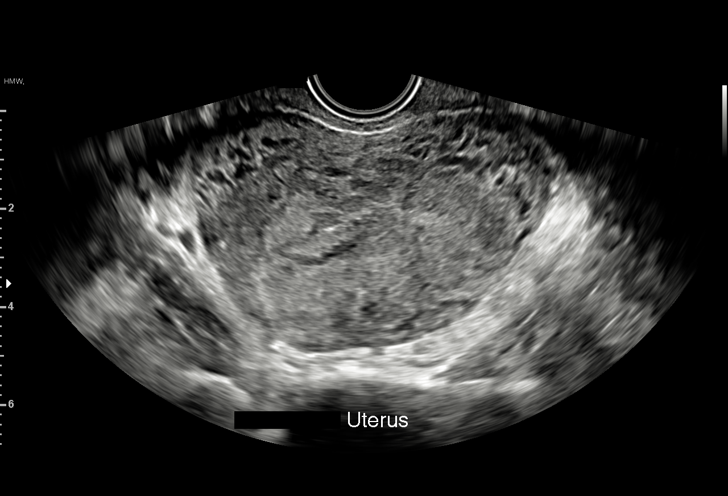
[im 37/66]
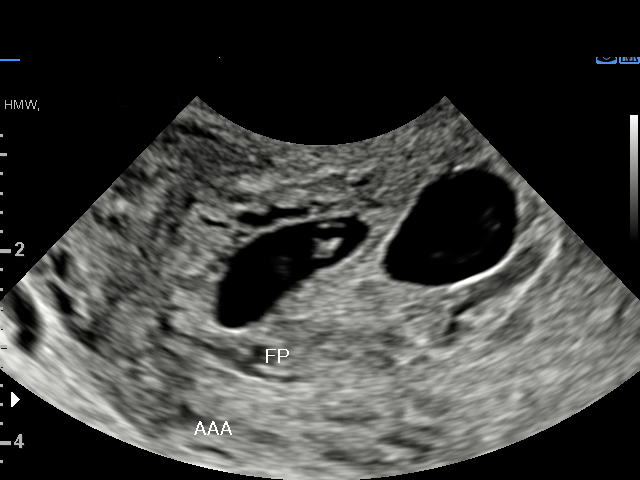
[im 41/66]
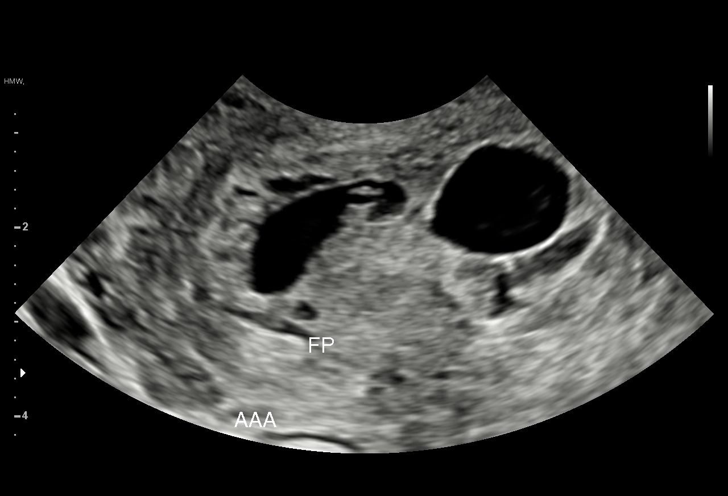
[im 46/66]
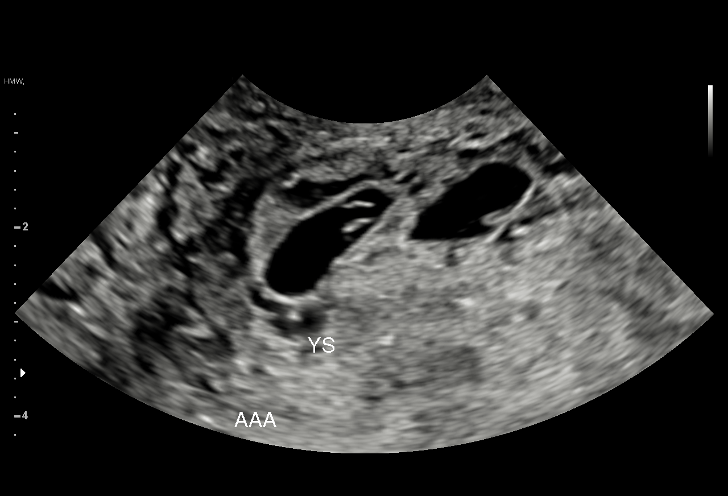
[im 51/66]
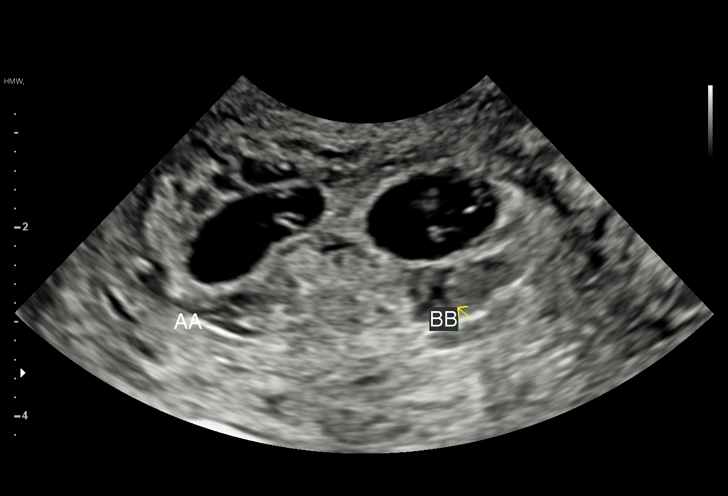
[im 56/66]
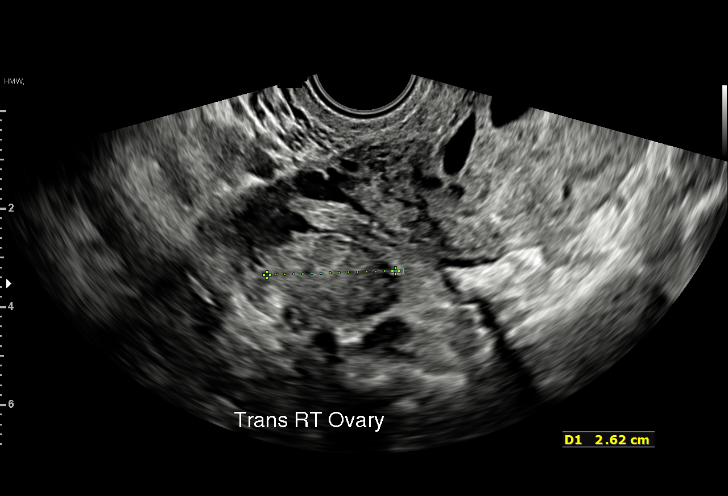
[im 61/66]
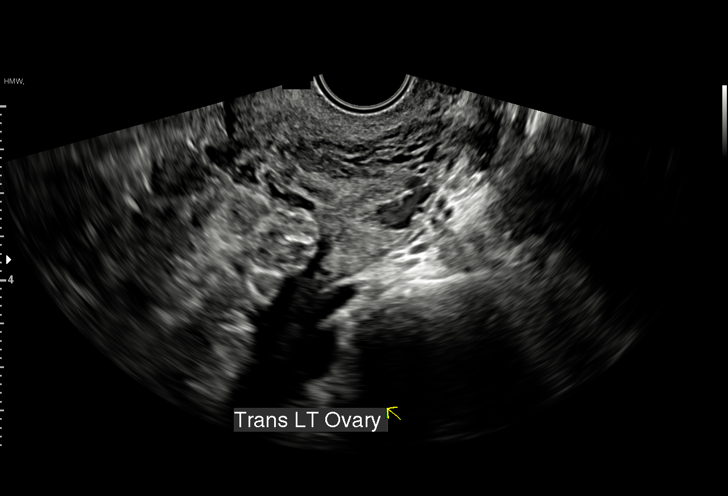
[im 66/66]
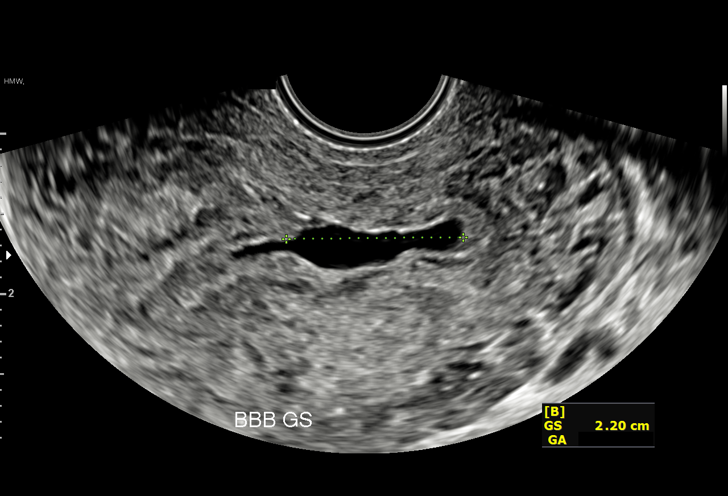

[15 of 28 positions shown; findings below may reference images not displayed]

FINDINGS: Number of IUPs:  2

Chorionicity/Amnionicity:  Dichorionic-diamniotic (thick membrane)

TWIN 1

Yolk sac:  Visualized.

Embryo:  Visualized.

Cardiac Activity: Not Visualized.

CRL:  3.2 mm   5 w 6 d                  US EDC: June 07, 2021.

TWIN 2

Yolk sac:  Visualized.

Embryo:  Not Visualized.

Cardiac Activity: Not Visualized.

MSD: 16.8 mm   6 w   3 d

Subchorionic hemorrhage:  None visualized.

Maternal uterus/adnexae: Ovaries are unremarkable. No free fluid is
noted.
IMPRESSION: Twin gestation is noted. One twin demonstrates yolk sac and fetal
pole, but no fetal cardiac activity is noted. The other twin
gestation demonstrates only a gestational sac, without yolk sac or
embryo. Recommend follow-up quantitative B-HCG levels and follow-up
US in 14 days to assess viability. This recommendation follows SRU
consensus guidelines: Diagnostic Criteria for Nonviable Pregnancy
Early in the First Trimester. N Engl J Med 8256; [DATE].

## 2023-05-25 ENCOUNTER — Ambulatory Visit: Payer: BLUE CROSS/BLUE SHIELD

## 2023-06-01 LAB — OB RESULTS CONSOLE RPR: RPR: NONREACTIVE

## 2023-06-01 LAB — OB RESULTS CONSOLE ABO/RH: RH Type: POSITIVE

## 2023-06-01 LAB — URINE CULTURE
Drug Screen, Urine: NEGATIVE
Glucose 1 Hour: 181
Pap: NEGATIVE

## 2023-06-01 LAB — OB RESULTS CONSOLE HEPATITIS B SURFACE ANTIGEN: Hepatitis B Surface Ag: NEGATIVE

## 2023-06-01 LAB — OB RESULTS CONSOLE VARICELLA ZOSTER ANTIBODY, IGG: Varicella: IMMUNE

## 2023-06-01 LAB — OB RESULTS CONSOLE ANTIBODY SCREEN: Antibody Screen: NEGATIVE

## 2023-06-01 LAB — OB RESULTS CONSOLE RUBELLA ANTIBODY, IGM: Rubella: IMMUNE

## 2023-06-01 LAB — OB RESULTS CONSOLE HIV ANTIBODY (ROUTINE TESTING): HIV: NONREACTIVE

## 2023-06-02 ENCOUNTER — Telehealth: Payer: Self-pay | Admitting: Family Medicine

## 2023-06-02 LAB — GLUCOSE, 3 HOUR GESTATIONAL
Glucose 1 Hour: 186
Glucose 2 Hour: 185
Glucose 3 Hour: 136
Glucose, Fasting: 91

## 2023-06-02 IMAGING — US US OB < 14 WEEKS - US OB TV
1 series · 14 of 28 positions shown · non-contrast
Comparison: None.
COMPARISON: None.

Addendum:
CLINICAL DATA: Vaginal bleeding, abdominal pain and back pain.

EXAM:
TWIN OBSTETRIC <14WK US AND TRANSVAGINAL OB US
TECHNIQUE: Both transabdominal and transvaginal ultrasound examinations were
performed for complete evaluation of the gestation as well as the
maternal uterus, adnexal regions, and pelvic cul-de-sac.
Transvaginal technique was performed to assess early pregnancy.

[Series 1: us ob < 14 weeks - us ob tv · 67 acquisitions, 14 frames shown]
[im 3/67]
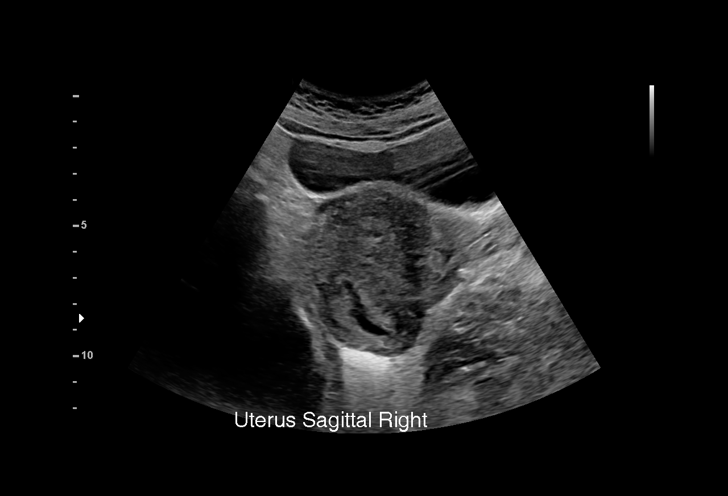
[im 8/67]
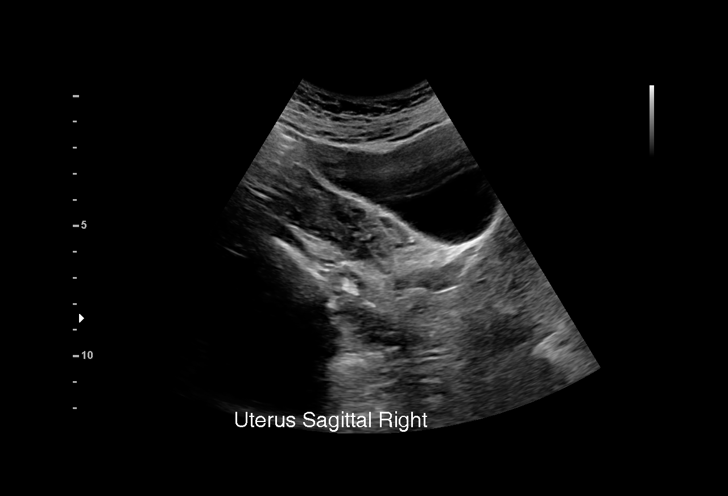
[im 13/67]
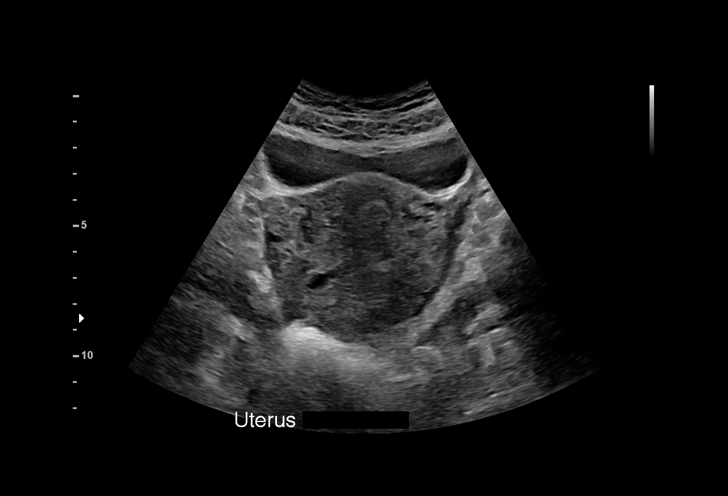
[im 18/67]
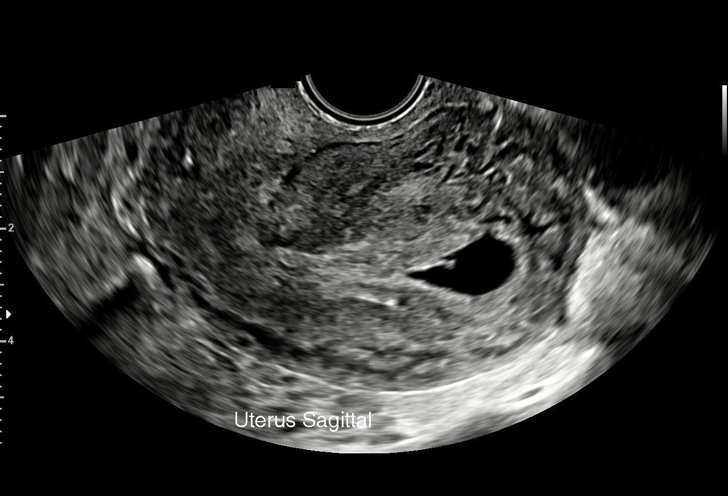
[im 23/67]
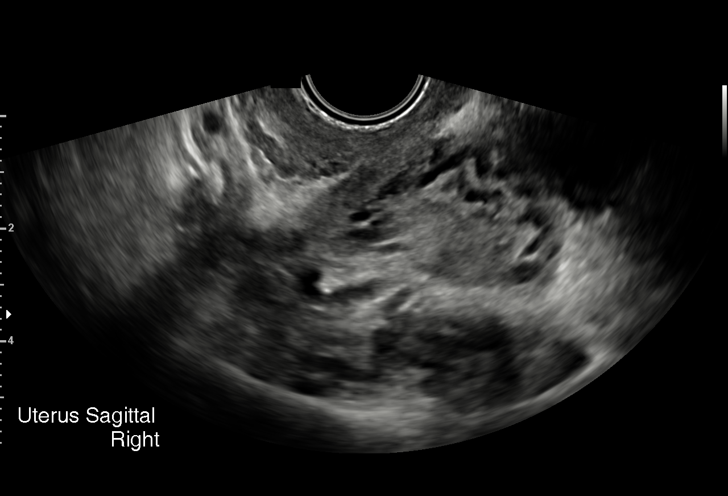
[im 27/67]
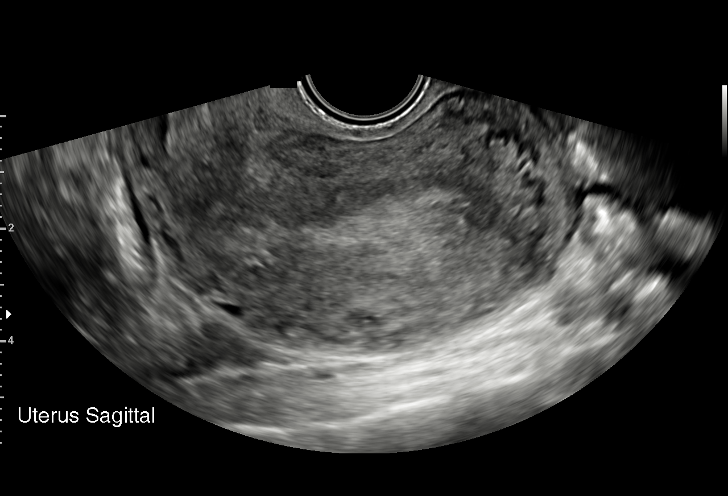
[im 32/67]
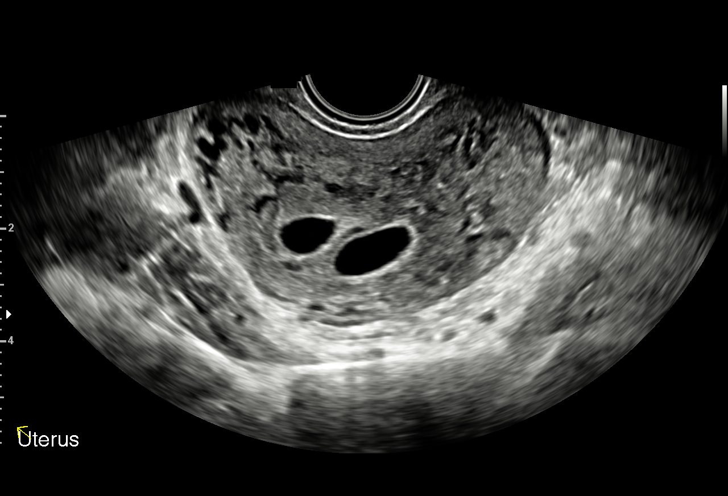
[im 37/67]
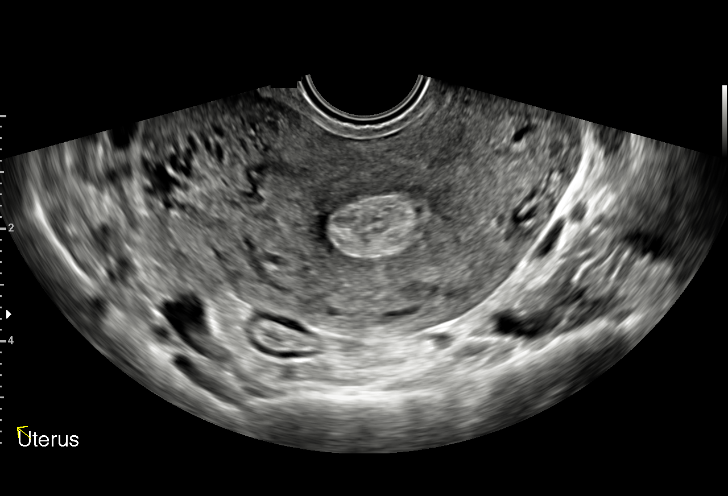
[im 42/67]
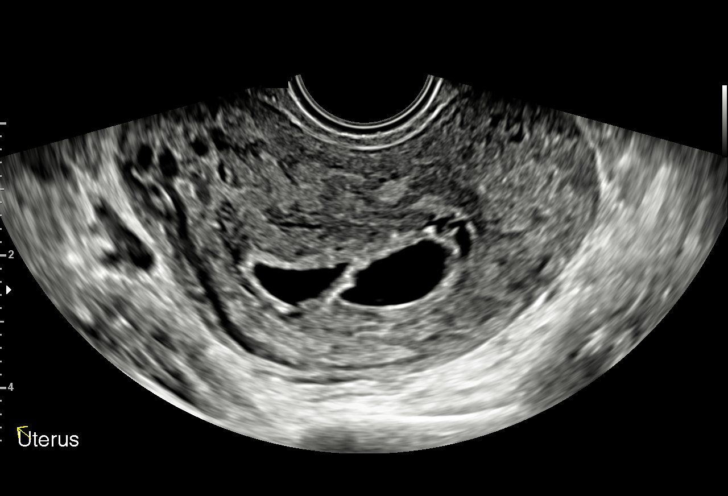
[im 47/67]
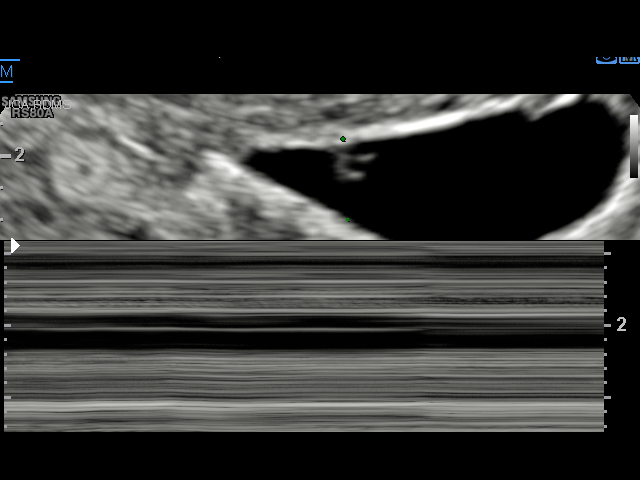
[im 52/67]
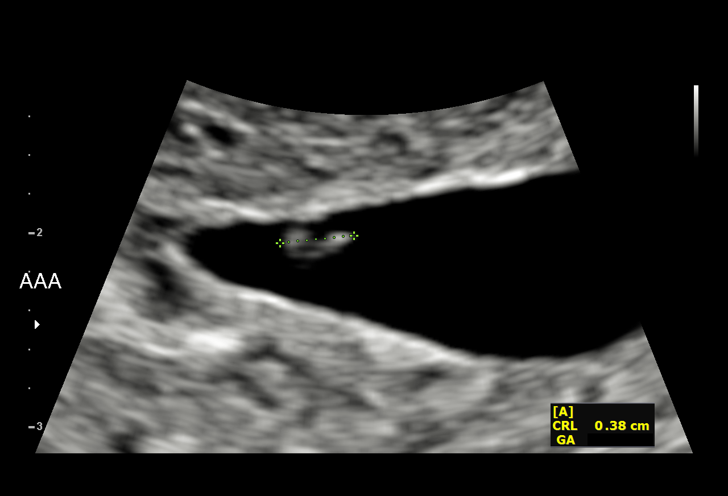
[im 57/67]
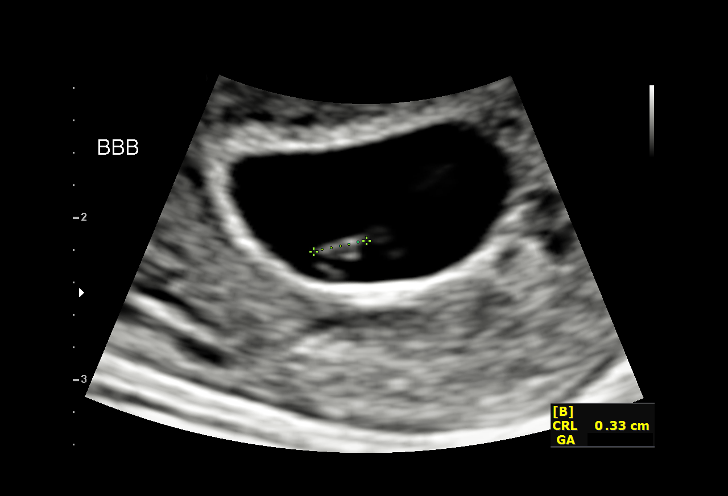
[im 62/67]
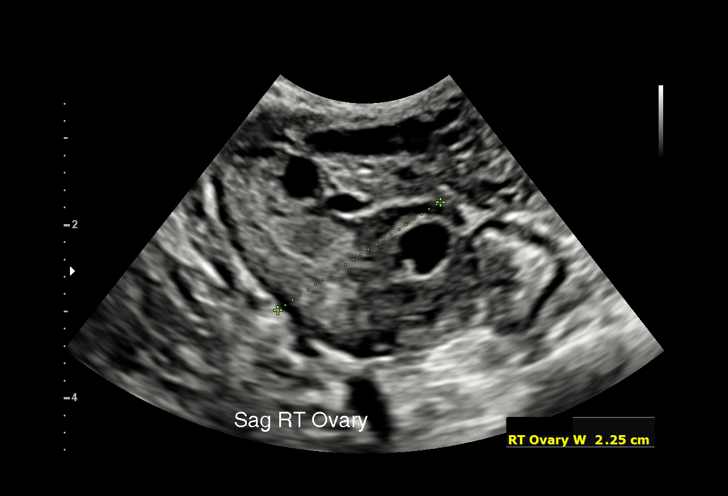
[im 67/67]
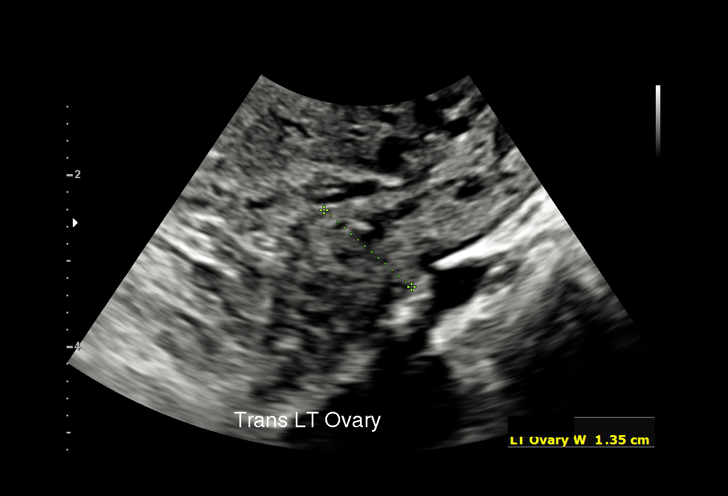

[14 of 28 positions shown; findings below may reference images not displayed]

FINDINGS: Number of IUPs:  2

Chorionicity/Amnionicity:  Dichorionic-diamniotic (thick membrane)

TWIN 1

Yolk sac:  Yes

Embryo:  Yes

Cardiac Activity: No

Heart Rate: n/a bpm

CRL:  3.9 mm   6 w 0 d                  US EDC: 06/18/2021

TWIN 2

Yolk sac:  Yes

Embryo:  Yes

Cardiac Activity: No

Heart Rate: N/a bpm

CRL:  3.2 mm   5 w 6 d                  US EDC: 06/19/2021

Subchorionic hemorrhage:  None visualized.

Maternal uterus/adnexae:

Right ovary: Normal

Left ovary: Normal

Other :None

Free fluid:  None
IMPRESSION: 1. Twin gestation noted. There are 2 gestational sacs each
containing a yolk sac and embryo. No cardiac activity associated
within either embryo. Findings are suspicious but not yet definitive
for failed twin pregnancies. Recommend follow-up US in 10-14 days
for definitive diagnosis. This recommendation follows SRU consensus
guidelines: Diagnostic Criteria for Nonviable Pregnancy Early in the
First Trimester. N Engl J Med 6931; [DATE].

ADDENDUM:
The following is a comparison with the original scan from
10/11/2020:

On the initial scan twin gestation 1 had an embryo without cardiac
activity. On today's exam absent cardiac activity with twin 1 embryo
is again documented compatible with definite failed pregnancy.

On the initial scan twin gestation 2 had no embryo. On today's exam
there is an embryo with a crown-rump length of 3.2 mm without
cardiac activity consistent with suspicious for failed pregnancy.

*** End of Addendum ***
FINDINGS: Number of IUPs:  2

Chorionicity/Amnionicity:  Dichorionic-diamniotic (thick membrane)

TWIN 1

Yolk sac:  Yes

Embryo:  Yes

Cardiac Activity: No

Heart Rate: n/a bpm

CRL:  3.9 mm   6 w 0 d                  US EDC: 06/18/2021

TWIN 2

Yolk sac:  Yes

Embryo:  Yes

Cardiac Activity: No

Heart Rate: N/a bpm

CRL:  3.2 mm   5 w 6 d                  US EDC: 06/19/2021

Subchorionic hemorrhage:  None visualized.

Maternal uterus/adnexae:

Right ovary: Normal

Left ovary: Normal

Other :None

Free fluid:  None
IMPRESSION: 1. Twin gestation noted. There are 2 gestational sacs each
containing a yolk sac and embryo. No cardiac activity associated
within either embryo. Findings are suspicious but not yet definitive
for failed twin pregnancies. Recommend follow-up US in 10-14 days
for definitive diagnosis. This recommendation follows SRU consensus
guidelines: Diagnostic Criteria for Nonviable Pregnancy Early in the
First Trimester. N Engl J Med 6931; [DATE].

## 2023-06-02 NOTE — Telephone Encounter (Signed)
our office recieved a call from [facility] Dept to get a patient scheduled, after reviewing her insurance she is out of Network with [facility]. The nurse said " just get her scheduled" I explain to her that I couldn't do that without talking to the patient to explain the process to her about how much she would have to pay out of pocket and give understanding about her insurance coverage I called the patient myself and explained everthing to her, she said she could not afford to pay anything out of pocket, so I told her to call her insurance company to see who is in [facility] with her insurance, patient understood and said she will call. All this information was reviewed with Izora Gala.

## 2023-06-29 ENCOUNTER — Encounter: Payer: Self-pay | Admitting: *Deleted

## 2023-06-29 DIAGNOSIS — B951 Streptococcus, group B, as the cause of diseases classified elsewhere: Secondary | ICD-10-CM | POA: Insufficient documentation

## 2023-06-29 DIAGNOSIS — O2340 Unspecified infection of urinary tract in pregnancy, unspecified trimester: Secondary | ICD-10-CM | POA: Insufficient documentation

## 2023-06-29 DIAGNOSIS — D563 Thalassemia minor: Secondary | ICD-10-CM | POA: Insufficient documentation

## 2023-06-29 DIAGNOSIS — O24419 Gestational diabetes mellitus in pregnancy, unspecified control: Secondary | ICD-10-CM | POA: Insufficient documentation

## 2023-06-29 DIAGNOSIS — O09529 Supervision of elderly multigravida, unspecified trimester: Secondary | ICD-10-CM | POA: Insufficient documentation

## 2023-06-29 DIAGNOSIS — O0990 Supervision of high risk pregnancy, unspecified, unspecified trimester: Secondary | ICD-10-CM | POA: Insufficient documentation

## 2023-06-29 DIAGNOSIS — D582 Other hemoglobinopathies: Secondary | ICD-10-CM | POA: Insufficient documentation

## 2023-06-29 DIAGNOSIS — Z603 Acculturation difficulty: Secondary | ICD-10-CM | POA: Insufficient documentation

## 2023-06-30 ENCOUNTER — Other Ambulatory Visit: Payer: Self-pay

## 2023-06-30 ENCOUNTER — Encounter: Attending: Obstetrics and Gynecology | Admitting: Dietician

## 2023-06-30 ENCOUNTER — Ambulatory Visit: Admitting: Dietician

## 2023-06-30 DIAGNOSIS — Z3A14 14 weeks gestation of pregnancy: Secondary | ICD-10-CM

## 2023-06-30 DIAGNOSIS — O24410 Gestational diabetes mellitus in pregnancy, diet controlled: Secondary | ICD-10-CM | POA: Insufficient documentation

## 2023-06-30 DIAGNOSIS — Z713 Dietary counseling and surveillance: Secondary | ICD-10-CM | POA: Diagnosis not present

## 2023-06-30 DIAGNOSIS — O24419 Gestational diabetes mellitus in pregnancy, unspecified control: Secondary | ICD-10-CM

## 2023-06-30 NOTE — Progress Notes (Signed)
 Patient was seen for Gestational Diabetes  on 06/30/2023  Start time 1110 and End time 1210   Estimated due date: 12/26/2023; [redacted]w[redacted]d  Interpreter - none - patient states that she does not need one.  She speaks the [employer] and states that she understands English better than the [place] interpretors.  Clinical: Medications: Prenatal vitamin Medical History: new GDM Labs: OGTT fasting 91, 1 hour 186, 2 hour 185 on 06/02/2023, A1c none available  Dietary and Lifestyle History: Patient lives with her boyfriend and children.  She works at a [employer] ([employer]). She does the shopping and cooking. Allergic to shellfish.  Physical Activity: none - walks when warm Stress: low Sleep: good  24 hr Recall:  First Meal:  none - "not a breakfast person" but tries to eat Snack:  NABS (2-3 crackers) Second meal:  salmon, rice, green beans Snack:  NABS Third meal:  strawberries with hot peppers, nuts Snack:  none Beverages:  water  NUTRITION INTERVENTION  Nutrition education (E-1) on the following topics:   Initial Follow-up  [x]  []  Definition of Gestational Diabetes [x]  []  Why dietary management is important in controlling blood glucose [x]  []  Effects each nutrient has on blood glucose levels [x]  []  Simple carbohydrates vs complex carbohydrates [x]  []  Fluid intake [x]  []  Creating a balanced meal plan []  []  Carbohydrate counting  [x]  []  When to check blood glucose levels [x]  []  Proper blood glucose monitoring techniques [x]  []  Effect of stress and stress reduction techniques  [x]  []  Exercise effect on blood glucose levels, appropriate exercise during pregnancy []  []  Importance of limiting caffeine and abstaining from alcohol and smoking [x]  []  Medications used for blood sugar control during pregnancy [x]  []  Hypoglycemia and rule of 15 [x]  []  Postpartum self care  Blood glucose monitor given: AccuChek Guide Me Lot # [id number] Exp: 03/31/2024 CBG: 98 mg/dL a short time after a small  snack  Patient instructed to monitor glucose levels: FBS: 60 - <= 95 mg/dL; 2 hour: <= 161 mg/dL  Patient received handouts: in [place] Nutrition Diabetes and Pregnancy Carbohydrate Counting List Blood glucose log Snack ideas for diabetes during pregnancy  Patient will be seen for follow-up as needed.

## 2023-07-03 ENCOUNTER — Encounter: Payer: Self-pay | Admitting: Obstetrics and Gynecology

## 2023-07-03 ENCOUNTER — Other Ambulatory Visit: Payer: Self-pay

## 2023-07-03 ENCOUNTER — Ambulatory Visit (INDEPENDENT_AMBULATORY_CARE_PROVIDER_SITE_OTHER): Admitting: Obstetrics and Gynecology

## 2023-07-03 VITALS — BP 116/77 | HR 80 | Wt 108.7 lb

## 2023-07-03 DIAGNOSIS — O0990 Supervision of high risk pregnancy, unspecified, unspecified trimester: Secondary | ICD-10-CM

## 2023-07-03 DIAGNOSIS — O09522 Supervision of elderly multigravida, second trimester: Secondary | ICD-10-CM | POA: Diagnosis not present

## 2023-07-03 DIAGNOSIS — D582 Other hemoglobinopathies: Secondary | ICD-10-CM | POA: Diagnosis not present

## 2023-07-03 DIAGNOSIS — B951 Streptococcus, group B, as the cause of diseases classified elsewhere: Secondary | ICD-10-CM

## 2023-07-03 DIAGNOSIS — O0992 Supervision of high risk pregnancy, unspecified, second trimester: Secondary | ICD-10-CM

## 2023-07-03 DIAGNOSIS — D563 Thalassemia minor: Secondary | ICD-10-CM

## 2023-07-03 DIAGNOSIS — Z3A14 14 weeks gestation of pregnancy: Secondary | ICD-10-CM

## 2023-07-03 DIAGNOSIS — O24410 Gestational diabetes mellitus in pregnancy, diet controlled: Secondary | ICD-10-CM | POA: Diagnosis not present

## 2023-07-03 DIAGNOSIS — O2342 Unspecified infection of urinary tract in pregnancy, second trimester: Secondary | ICD-10-CM

## 2023-07-03 NOTE — Progress Notes (Signed)
 INITIAL PRENATAL VISIT NOTE  Subjective:  Danielle Weiss is a 38 y.o. [id number] at [redacted]w[redacted]d by LMP being seen today for her initial prenatal visit.  She has an obstetric history significant for multiparity and advanced maternal age. She has an uncomplicated medical history .  Patient reports no complaints.  Contractions: Not present. Vag. Bleeding: None.  Movement: Absent. Denies leaking of fluid.    Past Medical History:  Diagnosis Date   GDM (gestational diabetes mellitus) 05/2023   History of food allergy 07/10/2015   Medical history non-contributory    Recurrent urticaria 07/10/2015   SAB (spontaneous abortion) 11/05/2020    Past Surgical History:  Procedure Laterality Date   NO PAST SURGERIES      OB History  Gravida Para Term Preterm AB Living  4 2 2  1 2   SAB IAB Ectopic Multiple Live Births  1    2    # Outcome Date GA Lbr Len/2nd Weight Sex Type Anes PTL Lv  4 Current           3 SAB 10/2020 [redacted]w[redacted]d         2 Term 04/30/10 [redacted]w[redacted]d  7 lb (3.175 kg) M Vag-Spont   LIV  1 Term 05/03/06 [redacted]w[redacted]d  6 lb (2.722 kg) F Vag-Spont   LIV    Social History   Socioeconomic History   Marital status: Single    Spouse name: Not on file   Number of children: Not on file   Years of education: Not on file   Highest education level: Not on file  Occupational History   Not on file  Tobacco Use   Smoking status: Never   Smokeless tobacco: Never  Vaping Use   Vaping status: Never Used  Substance and Sexual Activity   Alcohol use: No    Alcohol/week: 0.0 standard drinks of alcohol   Drug use: No   Sexual activity: Yes    Birth control/protection: None  Other Topics Concern   Not on file  Social History Narrative   Lives with husband and Child. Works at [employer] part time.         Social Drivers of Corporate investment banker Strain: Not on file  Food Insecurity: No Food Insecurity (11/06/2020)   Hunger Vital Sign    Worried About Running Out of Food in the Last Year: Never  true    Ran Out of Food in the Last Year: Never true  Transportation Needs: No Transportation Needs (11/06/2020)   PRAPARE - Administrator, Civil Service (Medical): No    Lack of Transportation (Non-Medical): No  Physical Activity: Not on file  Stress: Not on file  Social Connections: Not on file    Family History  Problem Relation Age of Onset   Hypertension Mother    Allergic rhinitis Neg Hx    Angioedema Neg Hx    Asthma Neg Hx    Atopy Neg Hx    Eczema Neg Hx    Immunodeficiency Neg Hx    Urticaria Neg Hx      Current Outpatient Medications:    aspirin EC 81 MG tablet, Take 81 mg by mouth daily. Swallow whole., Disp: , Rfl:    Prenatal Vit-Fe Fumarate-FA (MULTIVITAMIN-PRENATAL) 27-0.8 MG TABS tablet, Take 1 tablet by mouth daily at 12 noon., Disp: , Rfl:    EPINEPHrine (EPIPEN 2-PAK) 0.3 mg/0.3 mL IJ SOAJ injection, Inject 0.3 mLs (0.3 mg total) into the muscle once. (Patient not taking:  Reported on 07/03/2023), Disp: 1 Device, Rfl: 1  No Known Allergies  Review of Systems: Negative except for what is mentioned in HPI.  Objective:   Vitals:   07/03/23 0935  BP: 116/77  Pulse: 80  Weight: 108 lb 11.2 oz (49.3 kg)    Fetal Status: Fetal Heart Rate (bpm): 154   Movement: Absent     Physical Exam: BP 116/77   Pulse 80   Wt 108 lb 11.2 oz (49.3 kg)   LMP 03/21/2023 (Exact Date)   BMI 22.88 kg/m  CONSTITUTIONAL: Well-developed, well-nourished female in no acute distress.  NEUROLOGIC: Alert and oriented to person, place, and time. Normal reflexes, muscle tone coordination. No cranial nerve deficit noted. PSYCHIATRIC: Normal mood and affect. Normal behavior. Normal judgment and thought content. SKIN: Skin is warm and dry. No rash noted. Not diaphoretic. No erythema. No pallor. HENT:  Normocephalic, atraumatic, External right and left ear normal. Oropharynx is clear and moist EYES: Conjunctivae and EOM are normal.  NECK: Normal range of motion, supple, no  masses CARDIOVASCULAR: Normal heart rate noted, regular rhythm RESPIRATORY: Effort and breath sounds normal, no problems with respiration noted BREASTS: deferred ABDOMEN: Soft, nontender, nondistended, gravid. GN:FAOZHYQM MUSCULOSKELETAL: Normal range of motion. EXT:  No edema and no tenderness. 2+ distal pulses.   Assessment and Plan:  Pregnancy: [id number] at [redacted]w[redacted]d by LMP  1. Supervision of high risk pregnancy, antepartum (Primary) Continue routine prenatal care  2. 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 Multigravida of advanced maternal age in second trimester   5. Alpha thalassemia silent carrier   6. Group B Streptococcus urinary tract infection affecting pregnancy in second trimester Treat in labor   Preterm labor symptoms and general obstetric precautions including but not limited to vaginal bleeding, contractions, leaking of fluid and fetal movement were reviewed in detail with the patient.  Please refer to After Visit Summary for other counseling recommendations.   Return in about 3 weeks (around 07/24/2023) for in person.  Warden Fillers 07/03/2023 9:58 AM

## 2023-07-04 LAB — COMPREHENSIVE METABOLIC PANEL
ALT: 27 IU/L (ref 0–32)
AST: 24 IU/L (ref 0–40)
Albumin: 4.1 g/dL (ref 3.9–4.9)
Alkaline Phosphatase: 43 IU/L — ABNORMAL LOW (ref 44–121)
BUN/Creatinine Ratio: 11 (ref 9–23)
BUN: 7 mg/dL (ref 6–24)
Bilirubin Total: 0.2 mg/dL (ref 0.0–1.2)
CO2: 19 mmol/L — ABNORMAL LOW (ref 20–29)
Calcium: 9.3 mg/dL (ref 8.7–10.2)
Chloride: 104 mmol/L (ref 96–106)
Creatinine, Ser: 0.61 mg/dL (ref 0.57–1.00)
Globulin, Total: 2.7 g/dL (ref 1.5–4.5)
Glucose: 78 mg/dL (ref 70–99)
Potassium: 4.5 mmol/L (ref 3.5–5.2)
Sodium: 137 mmol/L (ref 134–144)
Total Protein: 6.8 g/dL (ref 6.0–8.5)
eGFR: 116 mL/min/{1.73_m2} (ref >59)

## 2023-07-07 ENCOUNTER — Encounter: Payer: Self-pay | Admitting: *Deleted

## 2023-07-21 ENCOUNTER — Other Ambulatory Visit: Payer: Self-pay | Admitting: Nurse Practitioner

## 2023-07-21 DIAGNOSIS — Z36 Encounter for antenatal screening for chromosomal anomalies: Secondary | ICD-10-CM

## 2023-07-28 ENCOUNTER — Ambulatory Visit (INDEPENDENT_AMBULATORY_CARE_PROVIDER_SITE_OTHER): Admitting: Obstetrics and Gynecology

## 2023-07-28 ENCOUNTER — Other Ambulatory Visit: Payer: Self-pay

## 2023-07-28 ENCOUNTER — Encounter: Payer: Self-pay | Admitting: Obstetrics and Gynecology

## 2023-07-28 VITALS — BP 114/77 | HR 85 | Wt 111.7 lb

## 2023-07-28 DIAGNOSIS — O09522 Supervision of elderly multigravida, second trimester: Secondary | ICD-10-CM

## 2023-07-28 DIAGNOSIS — D563 Thalassemia minor: Secondary | ICD-10-CM

## 2023-07-28 DIAGNOSIS — B951 Streptococcus, group B, as the cause of diseases classified elsewhere: Secondary | ICD-10-CM

## 2023-07-28 DIAGNOSIS — O0992 Supervision of high risk pregnancy, unspecified, second trimester: Secondary | ICD-10-CM

## 2023-07-28 DIAGNOSIS — O0990 Supervision of high risk pregnancy, unspecified, unspecified trimester: Secondary | ICD-10-CM

## 2023-07-28 DIAGNOSIS — O2342 Unspecified infection of urinary tract in pregnancy, second trimester: Secondary | ICD-10-CM

## 2023-07-28 DIAGNOSIS — O24410 Gestational diabetes mellitus in pregnancy, diet controlled: Secondary | ICD-10-CM

## 2023-07-28 DIAGNOSIS — Z3A18 18 weeks gestation of pregnancy: Secondary | ICD-10-CM

## 2023-07-28 MED ORDER — ACCU-CHEK GUIDE TEST VI STRP: 1.0000 | ORAL_STRIP | Freq: Four times a day (QID) | 12 refills | Status: DC

## 2023-07-28 MED ORDER — ACCU-CHEK SOFTCLIX LANCETS MISC: 12 refills | Status: DC

## 2023-07-28 MED ORDER — ASPIRIN 81 MG PO TBEC: 81.0000 mg | DELAYED_RELEASE_TABLET | Freq: Every day | ORAL | 12 refills | Status: DC

## 2023-07-28 NOTE — Progress Notes (Signed)
   PRENATAL VISIT NOTE  Subjective:  Danielle Weiss is a 38 y.o. [id number] at [redacted]w[redacted]d being seen today for ongoing prenatal care.  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  Patient reports no complaints.  Contractions: Not present.  .  Movement: Present. Denies leaking of fluid.   The following portions of the patient's history were reviewed and updated as appropriate: allergies, current medications, past family history, past medical history, past social history, past surgical history and problem list.   Objective:   Vitals:   07/28/23 0917  BP: 114/77  Pulse: 85  Weight: 111 lb 11.2 oz (50.7 kg)    Fetal Status: Fetal Heart Rate (bpm): 130   Movement: Present     General:  Alert, oriented and cooperative. Patient is in no acute distress.  Skin: Skin is warm and dry. No rash noted.   Cardiovascular: Normal heart rate noted  Respiratory: Normal respiratory effort, no problems with respiration noted  Abdomen: Soft, gravid, appropriate for gestational age.  Pain/Pressure: Absent     Pelvic: Cervical exam deferred        Extremities: Normal range of motion.  Edema: None  Mental Status: Normal mood and affect. Normal behavior. Normal judgment and thought content.   Assessment and Plan:  Pregnancy: [id number] at [redacted]w[redacted]d 1. Diet controlled gestational diabetes mellitus (GDM) in second trimester (Primary) CBGs well controlled, brought form today Not taking baby aspirin, rec start today  2. Group B Streptococcus urinary tract infection affecting pregnancy in second trimester Ppx in labor  3. Supervision of high risk pregnancy,  antepartum Anatomy scheduled for next week  4. Multigravida of advanced maternal age in second trimester  5. Alpha thalassemia silent carrier   Preterm labor symptoms and general obstetric precautions including but not limited to vaginal bleeding, contractions, leaking of fluid and fetal movement were reviewed in detail with the patient. Please refer to After Visit Summary for other counseling recommendations.   Return in about 1 month (around 08/27/2023) for high OB.  Future Appointments  Date Time Provider Department Center  08/03/2023  1:15 PM [facility] NURSE [facility]  08/03/2023  1:30 PM [facility] US5 [facility]  08/03/2023  2:00 PM [facility] PROVIDER 1 [facility]  08/03/2023  2:30 PM [facility] GENETIC COUNSELING RM [facility]    Jan Mcgill, MD

## 2023-07-28 NOTE — Addendum Note (Signed)
 Addended by: Dellie Fergusson on: 07/28/2023 09:55 AM   Modules accepted: Orders

## 2023-07-28 NOTE — Addendum Note (Signed)
 Addended by: Dellie Fergusson on: 07/28/2023 09:56 AM   Modules accepted: Orders

## 2023-07-28 NOTE — Patient Instructions (Addendum)
 Use Cerave twice per day on affected areas.

## 2023-07-29 ENCOUNTER — Ambulatory Visit

## 2023-07-29 NOTE — Progress Notes (Unsigned)
 Called pt and left message through [place] interpreter.

## 2023-08-03 ENCOUNTER — Other Ambulatory Visit: Payer: Self-pay | Admitting: *Deleted

## 2023-08-03 ENCOUNTER — Ambulatory Visit (HOSPITAL_BASED_OUTPATIENT_CLINIC_OR_DEPARTMENT_OTHER): Payer: BLUE CROSS/BLUE SHIELD | Admitting: Obstetrics

## 2023-08-03 ENCOUNTER — Ambulatory Visit: Payer: BLUE CROSS/BLUE SHIELD | Attending: Obstetrics and Gynecology

## 2023-08-03 ENCOUNTER — Ambulatory Visit: Payer: BLUE CROSS/BLUE SHIELD

## 2023-08-03 VITALS — BP 98/68 | HR 76

## 2023-08-03 DIAGNOSIS — D582 Other hemoglobinopathies: Secondary | ICD-10-CM | POA: Insufficient documentation

## 2023-08-03 DIAGNOSIS — Z3A19 19 weeks gestation of pregnancy: Secondary | ICD-10-CM | POA: Diagnosis present

## 2023-08-03 DIAGNOSIS — O0990 Supervision of high risk pregnancy, unspecified, unspecified trimester: Secondary | ICD-10-CM | POA: Diagnosis present

## 2023-08-03 DIAGNOSIS — Z36 Encounter for antenatal screening for chromosomal anomalies: Secondary | ICD-10-CM | POA: Diagnosis present

## 2023-08-03 DIAGNOSIS — D563 Thalassemia minor: Secondary | ICD-10-CM

## 2023-08-03 DIAGNOSIS — O99012 Anemia complicating pregnancy, second trimester: Secondary | ICD-10-CM | POA: Diagnosis not present

## 2023-08-03 DIAGNOSIS — Z603 Acculturation difficulty: Secondary | ICD-10-CM

## 2023-08-03 DIAGNOSIS — O24410 Gestational diabetes mellitus in pregnancy, diet controlled: Secondary | ICD-10-CM

## 2023-08-03 DIAGNOSIS — O09522 Supervision of elderly multigravida, second trimester: Secondary | ICD-10-CM | POA: Diagnosis not present

## 2023-08-03 DIAGNOSIS — O285 Abnormal chromosomal and genetic finding on antenatal screening of mother: Secondary | ICD-10-CM | POA: Diagnosis present

## 2023-08-03 DIAGNOSIS — O09523 Supervision of elderly multigravida, third trimester: Secondary | ICD-10-CM

## 2023-08-03 DIAGNOSIS — Z758 Other problems related to medical facilities and other health care: Secondary | ICD-10-CM | POA: Insufficient documentation

## 2023-08-03 NOTE — Progress Notes (Signed)
 [facility] at [facility] [address] Phone:  [phone]   Fax:  [phone]      In-Person Genetic Counseling Clinic Note:   I spoke with 38 y.o. Danielle Weiss today to discuss her carrier screening results. She was referred by Candiss Chamorro, MD. She was accompanied by FOB Danielle Weiss. The couple declined a [place] interpreter.   Pregnancy History:    [id number]. EGA: [redacted]w[redacted]d by LMP. EDD: 12/26/2023. [name] has two healthy children. She reports an early miscarriage, no associated health or genetic concerns. Denies personal history of diabetes, high blood pressure, thyroid conditions, and seizures. Denies bleeding, infections, and fevers in this pregnancy. Denies using tobacco, alcohol, or street drugs in this pregnancy.   Family History:    A three-generation pedigree was created and scanned into Epic under the Media tab.  Felina reports her sister died as an infant in [place] due to feeding difficulties. She reports that her sister may have had a cleft lip and/or palate. No other information is known. Without additional information, recurrence risk is difficult to estimate, as certain birth defects can be associated with genetic or multifactorial conditions.   Maternal ethnicity reported as [place] and paternal ethnicity reported as [place] Denies Ashkenazi Jewish ancestry.  Family history not remarkable for consanguinity, intellectual disability, autism spectrum disorder, multiple spontaneous abortions, still births, or unexplained neonatal death.   Silent Carrier for Alpha Thalassemia and Carrier for Hemoglobin E Disease:   Rebel is a silent carrier for alpha thalassemia and a carrier for hemoglobin E disease. She screened negative for the other two conditions (CF and SMA). This reduces but does not eliminate the chance of being a carrier for these two conditions. Please see report for details.   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   We reviewed the genetics of alpha thalassemia, autosomal recessive mode of inheritance, and clinical features of these conditions. We reviewed that [place] will either pass down two copies of the alpha globin gene (??) OR one copy of the alpha globin gene (-?) in each pregnancy. Therefore, this pregnancy is not at increased risk for hemoglobin Bart's due to four deletions of the alpha globin genes (--/--) regardless of her reproductive partner's carrier status. The pregnancy will be at increased risk (25%) for hemoglobin H disease if her partner is an alpha thalassemia carrier in the cis configuration (--/??).  Hemoglobin E: Danielle Weiss was found to be a carrier for hemoglobin E disease. 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 Therefore, she has both the usual type of hemoglobin A and the altered type, hemoglobin E (Hb A/E). Carriers are not expected to show symptoms.   We reviewed beta globin genes, hemoglobin, forms of beta-hemoglobinopathies and their natural histories, and the autosomal mode of inheritance. Danielle Weiss will either pass down either the normal beta globin gene that produces the normal hemoglobin A or the altered beta globin gene that produces hemoglobin E. There would be a 25% risk for the pregnancy to be affected with hemoglobin E disease (HbE/E) if Danielle Weiss's reproductive partner is also a carrier. There are several types of beta-hemoglobinopathies, including hemoglobin S, hemoglobin C, hemoglobin O, and beta-thalassemia. If Betrice's reproductive partner is found to be a carrier for a beta-hemoglobinopathy, there would be a 25% chance that this pregnancy would be affected.  The type of beta-hemoglobinopathy and symptoms will vary depending on the genotype.    In the meantime, we calculated the chance that this pregnancy would be affected with a  beta-hemoglobinopathy. Given [name]'s carrier screening results and FOB's ethnicity, there is a 1 in 216 (~0.5%) chance that the pregnancy would be affected.   FOB carrier screening offered: Based on these results, carrier screening for Nalany's reproductive partner was discussed and offered. We reviewed the benefits and limitations of carrier screening and that it can detect most but not all carriers.   The couple consented carrier screening for FOB. His blood was drawn during today's visit. He consented that we call Danielle Weiss with the results. We reviewed that if both were found to be carriers, prenatal diagnosis through amniocentesis would be available. We reviewed the technical aspects, benefits, risks, and limitations of amniocentesis including the 1 in 500 risk for miscarriage. Alternatively, testing can be completed postnatally. Of note, alpha thalassemia is not included on  's Newborn Screening (NBS) program.  Advanced Maternal Age:  We briefly discussed that the chance that a fetus would be affected with a chromosome difference increases with advanced maternal age. The chance that Danielle Weiss's current pregnancy would be affected with a chromosome difference based on her age alone is approximately 1 in 25 (~1.6%). This means that there is a 62 in 63 (~98.4%) chance that the fetus would not be affected with a chromosome difference. We discussed that this risk may also be lower given her low-risk NIPS results and normal anatomy ultrasound.  Danielle Weiss declined amniocentesis and elected for postnatal genetic testing of their newborn if indicated.   Previous Testing Completed:  Low risk NIPS: Danielle Weiss previously completed noninvasive prenatal screening (NIPS) in this pregnancy. The result is low risk, consistent with a female fetus. This screening significantly reduces but does not eliminate the chance that the current pregnancy has Down syndrome (trisomy 76), trisomy 35, trisomy 81, common sex chromosome  conditions, and 22q11.2 microdeletion syndrome. Please see report for details. There are many genetic conditions that cannot be detected by NIPS.    Plan of Care:   FOB carrier screening for alpha thalassemia and beta-hemoglobinopathies was drawn today. He requested we call Lea with the results. Ricca declined amniocentesis. Routine prenatal care.   Informed consent was obtained. All questions were answered.   60 minutes were spent on the date of the encounter in service to the patient including preparation, face-to-face consultation, discussion of test reports and available next steps, pedigree construction, genetic risk assessment, documentation, and care coordination.    Thank you for sharing in the care of Laloni with us .  Please do not hesitate to contact us  at [phone] if you have any questions.   Georgean Kindle, MS, [facility] Certified Genetic Counselor   Genetic counseling student involved in appointment: No.

## 2023-08-03 NOTE — Progress Notes (Signed)
 MFM Consult Note  Danielle Weiss is currently at 19 weeks and 2 days.  She was seen due to advanced maternal age (38 years old).  Her husband is 7 years old.    Her pregnancy has also been complicated by diet-controlled gestational diabetes.  She reports that her fingerstick values have mostly been within normal limits.  She had a cell free DNA test earlier in her pregnancy which indicated a low risk for trisomy 3, 76, and 13.  The patient did not want the fetal gender revealed today.  She was informed that the fetal growth and amniotic fluid level were appropriate for her gestational age.   There were no obvious fetal anomalies noted on today's ultrasound exam.  However, today's exam was limited due to the fetal position.  The patient was informed that anomalies may be missed due to technical limitations. If the fetus is in a suboptimal position or maternal habitus is increased, visualization of the fetus in the maternal uterus may be impaired.  The following were discussed during today's consultation:  Advanced maternal/paternal age in pregnancy  The increased risk of fetal aneuploidy due to advanced maternal age was discussed.   Due to advanced maternal age, the patient was offered and declined an amniocentesis today for definitive diagnosis of fetal aneuploidy.  She is comfortable with the low risk indicated by her cell free DNA test.  The patient is meeting with our genetic counselor following her ultrasound exam today.   Gestational diabetes   The implications and management of diabetes in pregnancy was discussed in detail with the patient.    She was advised to continue to monitor her fingersticks 4 times daily (fasting and 2 hours after each meal).    She was advised that our goals for her fingerstick values are fasting values of 90-95 or less and two-hour postprandial values of 120 or less.    Should the majority of her fingerstick results be above these values, she may have to  be started on insulin or metformin to help her achieve better glycemic control.   The patient was advised that getting her fingerstick values as close to these goals as possible would provide her with the most optimal obstetrical outcome.  The increased risk of polyhydramnios, fetal macrosomia, and preeclampsia associated with diabetes was also discussed.    Due to gestational diabetes, we will continue to follow her with monthly growth ultrasounds.    Weekly fetal testing should be started at 32 weeks should she require insulin or metformin for treatment. Otherwise, weekly fetal testing should be started at around 34 weeks due to advanced maternal age.  The patient was advised that delivery for well-controlled diabetes in pregnancy is usually recommended at around 39 weeks.    Delivery at 37 weeks may be considered should her glycemic control be poor.  A follow-up exam was scheduled in 4 weeks to assess the fetal growth.    The patient stated that all of her questions were answered today.  A total of 30 minutes was spent counseling and coordinating the care for this patient.  Greater than 50% of the time was spent in direct face-to-face contact.

## 2023-08-19 ENCOUNTER — Telehealth: Payer: Self-pay

## 2023-08-19 NOTE — Telephone Encounter (Signed)
 Attempted to call patient with FOB's carrier screening results (Danielle Weiss DOB 06/26/1971). FOB is also an alpha thalassemia silent carrier; he was not found to be a carrier for hemoglobin E disease. A [place] interpreter left a message to call back.

## 2023-08-25 ENCOUNTER — Other Ambulatory Visit (HOSPITAL_COMMUNITY)
Admission: RE | Admit: 2023-08-25 | Discharge: 2023-08-25 | Disposition: A | Discharge status: Home / Self Care | Source: Ambulatory Visit | Attending: Obstetrics and Gynecology | Admitting: Obstetrics and Gynecology

## 2023-08-25 ENCOUNTER — Ambulatory Visit: Admitting: Obstetrics and Gynecology

## 2023-08-25 ENCOUNTER — Ambulatory Visit: Payer: Self-pay

## 2023-08-25 ENCOUNTER — Telehealth: Payer: Self-pay

## 2023-08-25 ENCOUNTER — Encounter: Payer: Self-pay | Admitting: Obstetrics and Gynecology

## 2023-08-25 ENCOUNTER — Other Ambulatory Visit: Payer: Self-pay

## 2023-08-25 VITALS — BP 118/81 | HR 81 | Wt 116.4 lb

## 2023-08-25 DIAGNOSIS — B951 Streptococcus, group B, as the cause of diseases classified elsewhere: Secondary | ICD-10-CM

## 2023-08-25 DIAGNOSIS — N898 Other specified noninflammatory disorders of vagina: Secondary | ICD-10-CM | POA: Insufficient documentation

## 2023-08-25 DIAGNOSIS — L299 Pruritus, unspecified: Secondary | ICD-10-CM

## 2023-08-25 DIAGNOSIS — O09522 Supervision of elderly multigravida, second trimester: Secondary | ICD-10-CM | POA: Diagnosis not present

## 2023-08-25 DIAGNOSIS — O0992 Supervision of high risk pregnancy, unspecified, second trimester: Secondary | ICD-10-CM

## 2023-08-25 DIAGNOSIS — O2342 Unspecified infection of urinary tract in pregnancy, second trimester: Secondary | ICD-10-CM | POA: Diagnosis not present

## 2023-08-25 DIAGNOSIS — O0990 Supervision of high risk pregnancy, unspecified, unspecified trimester: Secondary | ICD-10-CM

## 2023-08-25 DIAGNOSIS — O24410 Gestational diabetes mellitus in pregnancy, diet controlled: Secondary | ICD-10-CM

## 2023-08-25 DIAGNOSIS — Z603 Acculturation difficulty: Secondary | ICD-10-CM

## 2023-08-25 DIAGNOSIS — Z3A22 22 weeks gestation of pregnancy: Secondary | ICD-10-CM

## 2023-08-25 DIAGNOSIS — D563 Thalassemia minor: Secondary | ICD-10-CM

## 2023-08-25 NOTE — Assessment & Plan Note (Signed)
 FOB also silent carrier-patient declines amniocentesis

## 2023-08-25 NOTE — Telephone Encounter (Signed)
 I called the patient to discuss her partner's carrier screening results (Bih Rmah DOB 06/26/1971). He was also found to be a silent carrier for alpha thalassemia. 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 Please see report for details. We discussed that this couple's current and future pregnancies are not at an increased risk for alpha thalassemia, including hemoglobin Bart's disease. Based on the results, there is a 25% chance the current pregnancy would be an alpha thalassemia carrier in the trans configuration (-?/-?). There is a 50% chance the pregnancy would be silent carrier for alpha thalassemia (??/-?). There is a 25% chance the pregnancy would be unaffected and not a carrier (??/??).  Moreover, he was not found to be a carrier for hemoglobin E disease or other beta-hemoglobinopathies. The chance their pregnancy would be affected with hemoglobin E disease is very low.  Georgean Kindle, MS, [facility] Certified Genetic Counselor [facility] [phone]

## 2023-08-25 NOTE — Patient Instructions (Signed)
 Use Cerave or shea/cocoa butter on itchy skin

## 2023-08-25 NOTE — Progress Notes (Signed)
   PRENATAL VISIT NOTE  Subjective:  Danielle Weiss is a 38 y.o. [id number] at [redacted]w[redacted]d being seen today for ongoing prenatal care.  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  Patient reports some yellow discharge, occasionally itchy. Two itchy spots, one of left shin and one on left forewarm.  Contractions: Irritability. Vag. Bleeding: None.  Movement: Present. Denies leaking of fluid.   The following portions of the patient's history were reviewed and updated as appropriate: allergies, current medications, past family history, past medical history, past social history, past surgical history and problem list.   Objective:   Vitals:   08/25/23 0904  BP: 118/81  Pulse: 81  Weight: 116 lb 6.4 oz (52.8 kg)     Fetal Status: Fetal Heart Rate (bpm): 156   Movement: Present      General: Alert, oriented and cooperative. Patient is in no acute distress.  Skin: Skin is warm and dry. No rash noted.   Cardiovascular: Normal heart rate noted  Respiratory: Normal respiratory effort, no problems with respiration noted  Abdomen: Soft, gravid, appropriate for gestational age.  Pain/Pressure: Present     Pelvic: Cervical exam deferred        Extremities: Normal range of motion.  Edema: Trace  Mental Status: Normal mood and affect. Normal behavior. Normal judgment and thought content.   Assessment and Plan:  Pregnancy: [id number] at [redacted]w[redacted]d  1. Diet controlled gestational diabetes mellitus (GDM) in second trimester (Primary) Reviewed logs, they are incomplete and encouraged her to (maybe 60% of filled out) continue checking All are within range Cont diet  control  2. Group B Streptococcus urinary tract infection affecting pregnancy in second trimester ppx in labor  3. Supervision of high risk pregnancy, antepartum  4. Language barrier No interpretor used  5. Alpha thalassemia silent carrier FOB positive for alpha thal carrier--patient declines amniocentesis  6. Multigravida of advanced maternal age in second trimester Low risk NIPs  7. [redacted] weeks gestation of pregnancy  8. Vaginal itching Self swab - Cervicovaginal ancillary only  9. Itchy skin Has two plaques, with appearance of psoriasis Rec cerave/shea butter   Preterm labor symptoms and general obstetric precautions including but not limited to vaginal bleeding, contractions, leaking of fluid and fetal movement were reviewed in detail with the patient. Please refer to After Visit Summary for other counseling recommendations.   Return in about 1 month (around 09/25/2023) for high OB.  Future Appointments  Date Time Provider Department Center  09/01/2023  3:00 PM [facility] PROVIDER 1 [facility]  09/01/2023  3:30 PM [facility] US1 [facility]  09/22/2023  8:15 AM Jan Mcgill, MD [facility]    Jan Mcgill, MD

## 2023-08-26 ENCOUNTER — Ambulatory Visit: Payer: Self-pay | Admitting: Obstetrics and Gynecology

## 2023-08-26 LAB — CERVICOVAGINAL ANCILLARY ONLY
Bacterial Vaginitis (gardnerella): POSITIVE — AB
Candida Glabrata: NEGATIVE
Candida Vaginitis: POSITIVE — AB
Comment: NEGATIVE
Comment: NEGATIVE
Comment: NEGATIVE

## 2023-08-26 MED ORDER — METRONIDAZOLE 500 MG PO TABS: 500.0000 mg | ORAL_TABLET | Freq: Two times a day (BID) | ORAL | 0 refills | Status: AC

## 2023-08-26 MED ORDER — CLOTRIMAZOLE 1 % VA CREA: 1.0000 | TOPICAL_CREAM | Freq: Every day | VAGINAL | 2 refills | Status: AC

## 2023-09-01 ENCOUNTER — Other Ambulatory Visit: Payer: Self-pay | Admitting: *Deleted

## 2023-09-01 ENCOUNTER — Ambulatory Visit (HOSPITAL_BASED_OUTPATIENT_CLINIC_OR_DEPARTMENT_OTHER)

## 2023-09-01 ENCOUNTER — Ambulatory Visit: Attending: Obstetrics | Admitting: Maternal & Fetal Medicine

## 2023-09-01 VITALS — BP 105/66 | HR 73

## 2023-09-01 DIAGNOSIS — O24410 Gestational diabetes mellitus in pregnancy, diet controlled: Secondary | ICD-10-CM | POA: Insufficient documentation

## 2023-09-01 DIAGNOSIS — Z3A23 23 weeks gestation of pregnancy: Secondary | ICD-10-CM | POA: Insufficient documentation

## 2023-09-01 DIAGNOSIS — Z603 Acculturation difficulty: Secondary | ICD-10-CM

## 2023-09-01 DIAGNOSIS — D563 Thalassemia minor: Secondary | ICD-10-CM | POA: Diagnosis not present

## 2023-09-01 DIAGNOSIS — Z148 Genetic carrier of other disease: Secondary | ICD-10-CM | POA: Insufficient documentation

## 2023-09-01 DIAGNOSIS — O0990 Supervision of high risk pregnancy, unspecified, unspecified trimester: Secondary | ICD-10-CM

## 2023-09-01 DIAGNOSIS — O99012 Anemia complicating pregnancy, second trimester: Secondary | ICD-10-CM

## 2023-09-01 DIAGNOSIS — O322XX0 Maternal care for transverse and oblique lie, not applicable or unspecified: Secondary | ICD-10-CM | POA: Diagnosis not present

## 2023-09-01 DIAGNOSIS — O09522 Supervision of elderly multigravida, second trimester: Secondary | ICD-10-CM

## 2023-09-01 DIAGNOSIS — O0992 Supervision of high risk pregnancy, unspecified, second trimester: Secondary | ICD-10-CM

## 2023-09-01 DIAGNOSIS — D582 Other hemoglobinopathies: Secondary | ICD-10-CM

## 2023-09-01 DIAGNOSIS — Z758 Other problems related to medical facilities and other health care: Secondary | ICD-10-CM

## 2023-09-01 DIAGNOSIS — Z363 Encounter for antenatal screening for malformations: Secondary | ICD-10-CM | POA: Diagnosis present

## 2023-09-01 DIAGNOSIS — O24419 Gestational diabetes mellitus in pregnancy, unspecified control: Secondary | ICD-10-CM

## 2023-09-01 NOTE — Progress Notes (Signed)
 After review, MFM consult with provider is not indicated for today  Kristi Petties, MD 09/01/2023 4:14 PM  [facility]

## 2023-09-22 ENCOUNTER — Ambulatory Visit: Admitting: Obstetrics and Gynecology

## 2023-09-22 ENCOUNTER — Other Ambulatory Visit: Payer: Self-pay

## 2023-09-22 VITALS — BP 111/78 | HR 76 | Wt 119.0 lb

## 2023-09-22 DIAGNOSIS — O2342 Unspecified infection of urinary tract in pregnancy, second trimester: Secondary | ICD-10-CM | POA: Diagnosis not present

## 2023-09-22 DIAGNOSIS — O0990 Supervision of high risk pregnancy, unspecified, unspecified trimester: Secondary | ICD-10-CM

## 2023-09-22 DIAGNOSIS — O24410 Gestational diabetes mellitus in pregnancy, diet controlled: Secondary | ICD-10-CM | POA: Diagnosis not present

## 2023-09-22 DIAGNOSIS — D563 Thalassemia minor: Secondary | ICD-10-CM | POA: Diagnosis not present

## 2023-09-22 DIAGNOSIS — Z23 Encounter for immunization: Secondary | ICD-10-CM

## 2023-09-22 DIAGNOSIS — O99112 Other diseases of the blood and blood-forming organs and certain disorders involving the immune mechanism complicating pregnancy, second trimester: Secondary | ICD-10-CM | POA: Diagnosis not present

## 2023-09-22 DIAGNOSIS — O09523 Supervision of elderly multigravida, third trimester: Secondary | ICD-10-CM

## 2023-09-22 DIAGNOSIS — B951 Streptococcus, group B, as the cause of diseases classified elsewhere: Secondary | ICD-10-CM

## 2023-09-22 DIAGNOSIS — Z3A26 26 weeks gestation of pregnancy: Secondary | ICD-10-CM

## 2023-09-22 NOTE — Progress Notes (Addendum)
   PRENATAL VISIT NOTE  Subjective:  Danielle Weiss is a 38 y.o. [id number] at [redacted]w[redacted]d being seen today for ongoing prenatal care.  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  Patient reports occasional cramping.  Contractions: Not present. Vag. Bleeding: None.  Movement: Present. Denies leaking of fluid.   The following portions of the patient's history were reviewed and updated as appropriate: allergies, current medications, past family history, past medical history, past social history, past surgical history and problem list.   Objective:    Vitals:   09/22/23 0837  BP: 111/78  Pulse: 76  Weight: 119 lb (54 kg)    Fetal Status:  Fetal Heart Rate (bpm): 156   Movement: Present    General: Alert, oriented and cooperative. Patient is in no acute distress.  Skin: Skin is warm and dry. No rash noted.   Cardiovascular: Normal heart rate noted  Respiratory: Normal respiratory effort, no problems with respiration noted  Abdomen: Soft, gravid, appropriate for gestational age.  Pain/Pressure: Absent     Pelvic: Cervical exam deferred        Extremities: Normal range of motion.  Edema: None  Mental Status: Normal mood and affect. Normal behavior. Normal judgment and thought content.   Assessment and Plan:  Pregnancy: G4P2012 at [redacted]w[redacted]d  1. [redacted] weeks gestation of pregnancy (Primary) - HIV antibody (with reflex) - CBC - RPR - Tdap vaccine greater than or equal to 7yo IM Recommended tdap and gave info, she declined and will consider  2. Group B Streptococcus urinary tract infection affecting pregnancy in second trimester Ppx in labor  3. Diet  controlled gestational diabetes mellitus (GDM) in second trimester Log incomplete but all within range Cont diet control  4. Alpha thalassemia silent carrier FOB positive for alpha thal carrier--patient declines amniocentesis   5. Multigravida of advanced maternal age in third trimester Testing starting 34 weeks  6. Supervision of high risk pregnancy, antepartum   Preterm labor symptoms and general obstetric precautions including but not limited to vaginal bleeding, contractions, leaking of fluid and fetal movement were reviewed in detail with the patient. Please refer to After Visit Summary for other counseling recommendations.   Return in about 2 weeks (around 10/06/2023) for high OB.  Future Appointments  Date Time Provider Department Center  10/06/2023  3:00 PM [facility] PROVIDER 1 [facility]  10/06/2023  3:30 PM [facility] US4 [facility]  11/03/2023  3:15 PM [facility] PROVIDER 1 [facility]  11/03/2023  3:45 PM [facility] US5 [facility]    Jan Mcgill, MD

## 2023-09-23 LAB — CBC
Hematocrit: 37.2 % (ref 34.0–46.6)
Hemoglobin: 11.6 g/dL (ref 11.1–15.9)
MCH: 29 pg (ref 26.6–33.0)
MCHC: 31.2 g/dL — ABNORMAL LOW (ref 31.5–35.7)
MCV: 93 fL (ref 79–97)
Platelets: 283 10*3/uL (ref 150–450)
RBC: 4 x10E6/uL (ref 3.77–5.28)
RDW: 13.8 % (ref 11.7–15.4)
WBC: 9.8 10*3/uL (ref 3.4–10.8)

## 2023-09-23 LAB — HIV ANTIBODY (ROUTINE TESTING W REFLEX): HIV Screen 4th Generation wRfx: NONREACTIVE

## 2023-09-23 LAB — RPR: RPR Ser Ql: NONREACTIVE

## 2023-10-06 ENCOUNTER — Ambulatory Visit: Admitting: Obstetrics and Gynecology

## 2023-10-06 ENCOUNTER — Other Ambulatory Visit: Payer: Self-pay | Admitting: *Deleted

## 2023-10-06 ENCOUNTER — Ambulatory Visit: Attending: Obstetrics and Gynecology

## 2023-10-06 VITALS — BP 109/71 | HR 80

## 2023-10-06 DIAGNOSIS — O24419 Gestational diabetes mellitus in pregnancy, unspecified control: Secondary | ICD-10-CM | POA: Insufficient documentation

## 2023-10-06 DIAGNOSIS — D563 Thalassemia minor: Secondary | ICD-10-CM | POA: Diagnosis present

## 2023-10-06 DIAGNOSIS — O09523 Supervision of elderly multigravida, third trimester: Secondary | ICD-10-CM

## 2023-10-06 DIAGNOSIS — O09522 Supervision of elderly multigravida, second trimester: Secondary | ICD-10-CM | POA: Insufficient documentation

## 2023-10-06 DIAGNOSIS — Z758 Other problems related to medical facilities and other health care: Secondary | ICD-10-CM

## 2023-10-06 DIAGNOSIS — O24410 Gestational diabetes mellitus in pregnancy, diet controlled: Secondary | ICD-10-CM

## 2023-10-06 DIAGNOSIS — O99113 Other diseases of the blood and blood-forming organs and certain disorders involving the immune mechanism complicating pregnancy, third trimester: Secondary | ICD-10-CM

## 2023-10-06 DIAGNOSIS — O0990 Supervision of high risk pregnancy, unspecified, unspecified trimester: Secondary | ICD-10-CM

## 2023-10-06 DIAGNOSIS — Z3A28 28 weeks gestation of pregnancy: Secondary | ICD-10-CM | POA: Insufficient documentation

## 2023-10-06 DIAGNOSIS — D582 Other hemoglobinopathies: Secondary | ICD-10-CM | POA: Insufficient documentation

## 2023-10-06 DIAGNOSIS — Z603 Acculturation difficulty: Secondary | ICD-10-CM

## 2023-10-06 DIAGNOSIS — Z148 Genetic carrier of other disease: Secondary | ICD-10-CM

## 2023-10-06 NOTE — Progress Notes (Signed)
 After review, MFM consult with provider is not indicated for today  Arna Ranks, MD 10/06/2023 3:50 PM  [facility]

## 2023-10-14 ENCOUNTER — Ambulatory Visit: Admitting: Obstetrics and Gynecology

## 2023-10-14 ENCOUNTER — Other Ambulatory Visit (HOSPITAL_COMMUNITY)
Admission: RE | Admit: 2023-10-14 | Discharge: 2023-10-14 | Disposition: A | Discharge status: Home / Self Care | Source: Ambulatory Visit | Attending: Obstetrics and Gynecology | Admitting: Obstetrics and Gynecology

## 2023-10-14 VITALS — BP 116/81 | HR 78 | Wt 119.8 lb

## 2023-10-14 DIAGNOSIS — O0990 Supervision of high risk pregnancy, unspecified, unspecified trimester: Secondary | ICD-10-CM

## 2023-10-14 DIAGNOSIS — D563 Thalassemia minor: Secondary | ICD-10-CM

## 2023-10-14 DIAGNOSIS — Z3A29 29 weeks gestation of pregnancy: Secondary | ICD-10-CM | POA: Diagnosis not present

## 2023-10-14 DIAGNOSIS — O99113 Other diseases of the blood and blood-forming organs and certain disorders involving the immune mechanism complicating pregnancy, third trimester: Secondary | ICD-10-CM

## 2023-10-14 DIAGNOSIS — O24410 Gestational diabetes mellitus in pregnancy, diet controlled: Secondary | ICD-10-CM

## 2023-10-14 DIAGNOSIS — N898 Other specified noninflammatory disorders of vagina: Secondary | ICD-10-CM

## 2023-10-14 DIAGNOSIS — D582 Other hemoglobinopathies: Secondary | ICD-10-CM

## 2023-10-14 DIAGNOSIS — O09523 Supervision of elderly multigravida, third trimester: Secondary | ICD-10-CM

## 2023-10-14 NOTE — Progress Notes (Signed)
   PRENATAL VISIT NOTE  Subjective:  Danielle Weiss is a 38 y.o. [id number] at [redacted]w[redacted]d being seen today for ongoing prenatal care.  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  Patient doing well with no acute concerns today. She reports vaginal irritation.   . Vag. Bleeding: None.  Movement: Present. Denies leaking of fluid.   The following portions of the patient's history were reviewed and updated as appropriate: allergies, current medications, past family history, past medical history, past social history, past surgical history and problem list. Problem list updated.  Objective:   Vitals:   10/14/23 0917  BP: 116/81  Pulse: 78  Weight: 119 lb 12.8 oz (54.3 kg)    Fetal Status: Fetal Heart Rate (bpm): 153 Fundal Height: 28 cm Movement: Present     General:  Alert, oriented and cooperative. Patient is in no acute distress.  Skin: Skin is warm and dry. No rash noted.   Cardiovascular: Normal heart rate noted  Respiratory: Normal respiratory effort, no problems with respiration noted  Abdomen: Soft, gravid, appropriate for gestational age.  Pain/Pressure: Present     Pelvic: Cervical exam deferred        Extremities: Normal range of motion.     Mental Status:  Normal mood and affect. Normal behavior. Normal judgment and thought content.   Assessment and Plan:  Pregnancy: [id number] at [redacted]w[redacted]d  1. [redacted] weeks gestation of pregnancy (Primary)   2. Diet controlled gestational diabetes mellitus (GDM) in third trimester FBS: 88-96 PPBS: 88-107  Good blood sugar control noted  3. Vaginal itching Pt will get self swab - Cervicovaginal ancillary  only  4. Supervision of high risk pregnancy, antepartum Continue routine prenatal care  5. 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 Multigravida of advanced maternal age in third trimester   7. Alpha thalassemia silent carrier   Preterm labor symptoms and general obstetric precautions including but not limited to vaginal bleeding, contractions, leaking of fluid and fetal movement were reviewed in detail with the patient.  Please refer to After Visit Summary for other counseling recommendations.   Return in about 2 weeks (around 10/28/2023) for [facility], in person.   Jerilynn Buddle, MD Faculty Attending Center for [facility]

## 2023-10-15 ENCOUNTER — Ambulatory Visit: Payer: Self-pay | Admitting: Obstetrics and Gynecology

## 2023-10-15 LAB — CERVICOVAGINAL ANCILLARY ONLY
Bacterial Vaginitis (gardnerella): POSITIVE — AB
Candida Glabrata: NEGATIVE
Candida Vaginitis: POSITIVE — AB
Comment: NEGATIVE
Comment: NEGATIVE
Comment: NEGATIVE
Comment: NEGATIVE
Trichomonas: NEGATIVE

## 2023-10-19 ENCOUNTER — Other Ambulatory Visit: Payer: Self-pay | Admitting: Obstetrics and Gynecology

## 2023-10-19 MED ORDER — METRONIDAZOLE 500 MG PO TABS: 500.0000 mg | ORAL_TABLET | Freq: Two times a day (BID) | ORAL | 0 refills | Status: DC

## 2023-10-19 MED ORDER — TERCONAZOLE 0.4 % VA CREA: 1.0000 | TOPICAL_CREAM | Freq: Every day | VAGINAL | 0 refills | Status: DC

## 2023-10-19 NOTE — Telephone Encounter (Addendum)
-----   Message from Jerilynn DELENA Buddle sent at 10/15/2023  3:42 PM EDT ----- BV and yeast noted on swab, offer treatment, advise pt to take antifungal after flagyl  treatment ----- Message ----- From: Interface, Lab In [facility] Sent: 10/15/2023   3:26 PM EDT To: Jerilynn DELENA Buddle, MD  Pt notified.  Pt verbalized understanding with no further questions.   Kirby Cortese,RN  10/19/23

## 2023-11-03 ENCOUNTER — Ambulatory Visit: Attending: Obstetrics and Gynecology | Admitting: Obstetrics

## 2023-11-03 ENCOUNTER — Ambulatory Visit

## 2023-11-03 VITALS — BP 95/70

## 2023-11-03 DIAGNOSIS — O99013 Anemia complicating pregnancy, third trimester: Secondary | ICD-10-CM | POA: Diagnosis not present

## 2023-11-03 DIAGNOSIS — D582 Other hemoglobinopathies: Secondary | ICD-10-CM

## 2023-11-03 DIAGNOSIS — Z3A32 32 weeks gestation of pregnancy: Secondary | ICD-10-CM

## 2023-11-03 DIAGNOSIS — O24410 Gestational diabetes mellitus in pregnancy, diet controlled: Secondary | ICD-10-CM

## 2023-11-03 DIAGNOSIS — B951 Streptococcus, group B, as the cause of diseases classified elsewhere: Secondary | ICD-10-CM

## 2023-11-03 DIAGNOSIS — Z315 Encounter for genetic counseling: Secondary | ICD-10-CM | POA: Insufficient documentation

## 2023-11-03 DIAGNOSIS — O24419 Gestational diabetes mellitus in pregnancy, unspecified control: Secondary | ICD-10-CM

## 2023-11-03 DIAGNOSIS — O09522 Supervision of elderly multigravida, second trimester: Secondary | ICD-10-CM

## 2023-11-03 DIAGNOSIS — D563 Thalassemia minor: Secondary | ICD-10-CM | POA: Diagnosis not present

## 2023-11-03 DIAGNOSIS — O0990 Supervision of high risk pregnancy, unspecified, unspecified trimester: Secondary | ICD-10-CM

## 2023-11-03 DIAGNOSIS — Z758 Other problems related to medical facilities and other health care: Secondary | ICD-10-CM

## 2023-11-03 DIAGNOSIS — O09523 Supervision of elderly multigravida, third trimester: Secondary | ICD-10-CM | POA: Diagnosis not present

## 2023-11-03 NOTE — Progress Notes (Signed)
 MFM Consult Note  Danielle Weiss is currently at 32 weeks and 3 days.  She has been followed due to advanced maternal age (38 years old) and diet-controlled gestational diabetes.    She denies any problems since her last exam and reports that her fingerstick values have been within normal limits.  On today's exam, the overall EFW of 4 pounds 3 ounces measures at the 30th percentile for her gestational age.    There was normal amniotic fluid noted with a total AFI of 13.84 cm.  Due to advanced maternal age and gestational diabetes, we will start weekly fetal testing at 34 weeks.    She will return in 2 weeks for a BPP.    Delivery should occur at around 39 weeks.    The patient stated that all of her questions were answered today.  A total of 20 minutes was spent counseling and coordinating the care for this patient.  Greater than 50% of the time was spent in direct face-to-face contact.

## 2023-11-05 ENCOUNTER — Encounter: Admitting: Obstetrics and Gynecology

## 2023-11-17 ENCOUNTER — Other Ambulatory Visit: Payer: Self-pay | Admitting: *Deleted

## 2023-11-17 ENCOUNTER — Ambulatory Visit (HOSPITAL_BASED_OUTPATIENT_CLINIC_OR_DEPARTMENT_OTHER)

## 2023-11-17 ENCOUNTER — Ambulatory Visit (HOSPITAL_BASED_OUTPATIENT_CLINIC_OR_DEPARTMENT_OTHER): Attending: Maternal & Fetal Medicine | Admitting: Maternal & Fetal Medicine

## 2023-11-17 VITALS — BP 109/74 | HR 71

## 2023-11-17 DIAGNOSIS — O24410 Gestational diabetes mellitus in pregnancy, diet controlled: Secondary | ICD-10-CM | POA: Insufficient documentation

## 2023-11-17 DIAGNOSIS — D582 Other hemoglobinopathies: Secondary | ICD-10-CM

## 2023-11-17 DIAGNOSIS — Z315 Encounter for genetic counseling: Secondary | ICD-10-CM | POA: Diagnosis not present

## 2023-11-17 DIAGNOSIS — Z3A34 34 weeks gestation of pregnancy: Secondary | ICD-10-CM | POA: Diagnosis not present

## 2023-11-17 DIAGNOSIS — Z148 Genetic carrier of other disease: Secondary | ICD-10-CM | POA: Insufficient documentation

## 2023-11-17 DIAGNOSIS — Z758 Other problems related to medical facilities and other health care: Secondary | ICD-10-CM

## 2023-11-17 DIAGNOSIS — D563 Thalassemia minor: Secondary | ICD-10-CM

## 2023-11-17 DIAGNOSIS — O09523 Supervision of elderly multigravida, third trimester: Secondary | ICD-10-CM | POA: Insufficient documentation

## 2023-11-17 DIAGNOSIS — B951 Streptococcus, group B, as the cause of diseases classified elsewhere: Secondary | ICD-10-CM

## 2023-11-17 DIAGNOSIS — O0990 Supervision of high risk pregnancy, unspecified, unspecified trimester: Secondary | ICD-10-CM

## 2023-11-17 DIAGNOSIS — O99013 Anemia complicating pregnancy, third trimester: Secondary | ICD-10-CM | POA: Diagnosis not present

## 2023-11-17 NOTE — Progress Notes (Signed)
   Patient information  Patient Name: Danielle Weiss  Patient MRN:   [id number]  Referring practice: MFM Referring Provider: 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 H5E7987 at [redacted]w[redacted]d here for ultrasound and consultation. Danielle Weiss is doing well today with no acute concerns. Today we focused on the following:   GDM: Blood sugars are all at goal with diet. Due to AMA > 40 she will continue antenatal testing with delivery in the 39th week.   The patient had time to ask questions that were answered to her satisfaction.  She verbalized understanding and agrees to proceed with the plan below.  Sonographic findings Single intrauterine pregnancy. Fetal cardiac activity: Observed. Presentation: Cephalic. Interval fetal anatomy appears normal. Fetal biometry shows the estimated fetal weight at the  percentile. Amniotic fluid: Within normal limits.  MVP: 5.16 cm. Placenta: Anterior. BPP: 8/8.   There are limitations of prenatal ultrasound such as the inability to detect certain abnormalities due to poor visualization. Various factors such as fetal position, gestational age and maternal body habitus may increase the difficulty in visualizing the fetal anatomy.    Recommendations -Growth in 2 weeks -Antenatal testing weekly - to be done with OB providers office -Delivery around [redacted] weeks gestation  Review of Systems: A review of systems was performed and was negative except per HPI    Vitals and Physical Exam    11/17/2023    2:39 PM 11/03/2023    3:31 PM 10/14/2023    9:17 AM  Vitals with BMI  Weight   119 lbs 13 oz  Systolic 109 95 116  Diastolic 74 70 81  Pulse 71  78    Sitting comfortably on the sonogram table Nonlabored breathing Normal rate and rhythm Abdomen is nontender  Past pregnancies OB History  Gravida Para Term Preterm AB Living  4 2 2  1 2   SAB IAB Ectopic Multiple Live Births  1    2    # Outcome Date GA Lbr Len/2nd Weight Sex Type Anes PTL Lv  4 Current           3 SAB 10/2020 [redacted]w[redacted]d         2 Term 04/30/10 [redacted]w[redacted]d  7 lb (3.175 kg) M Vag-Spont   LIV  1 Term 05/03/06 [redacted]w[redacted]d  6 lb (2.722 kg) F Vag-Spont   LIV     I spent 20 minutes reviewing the patients chart, including labs and images as well as counseling the patient about her medical conditions. Greater than 50% of the time was spent in direct face-to-face patient counseling.  Delora Smaller  MFM, [facility]   11/17/2023  3:14 PM

## 2023-11-19 ENCOUNTER — Encounter: Payer: Self-pay | Admitting: Obstetrics & Gynecology

## 2023-11-19 ENCOUNTER — Other Ambulatory Visit: Payer: Self-pay

## 2023-11-19 ENCOUNTER — Ambulatory Visit: Admitting: Obstetrics & Gynecology

## 2023-11-19 VITALS — BP 108/74 | HR 79 | Wt 123.4 lb

## 2023-11-19 DIAGNOSIS — Z3A34 34 weeks gestation of pregnancy: Secondary | ICD-10-CM

## 2023-11-19 DIAGNOSIS — O0990 Supervision of high risk pregnancy, unspecified, unspecified trimester: Secondary | ICD-10-CM

## 2023-11-19 DIAGNOSIS — O09523 Supervision of elderly multigravida, third trimester: Secondary | ICD-10-CM

## 2023-11-19 DIAGNOSIS — O24410 Gestational diabetes mellitus in pregnancy, diet controlled: Secondary | ICD-10-CM

## 2023-11-19 DIAGNOSIS — O2343 Unspecified infection of urinary tract in pregnancy, third trimester: Secondary | ICD-10-CM | POA: Diagnosis not present

## 2023-11-19 DIAGNOSIS — O0993 Supervision of high risk pregnancy, unspecified, third trimester: Secondary | ICD-10-CM

## 2023-11-19 DIAGNOSIS — B951 Streptococcus, group B, as the cause of diseases classified elsewhere: Secondary | ICD-10-CM

## 2023-11-19 NOTE — Progress Notes (Addendum)
 PRENATAL VISIT NOTE  Subjective:  Danielle Weiss is a 38 y.o. H5E7987 at [redacted]w[redacted]d being seen today for ongoing prenatal care.  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  Patient reports no complaints.  Contractions: Not present. Vag. Bleeding: None.  Movement: Present. Denies leaking of fluid.   The following portions of the patient's history were reviewed and updated as appropriate: allergies, current medications, past family history, past medical history, past social history, past surgical history and problem list.   Objective:    Vitals:   11/19/23 0827  BP: 108/74  Pulse: 79  Weight: 123 lb 6.4 oz (56 kg)    Fetal Status:  Fetal Heart Rate (bpm): 144   Movement: Present    General: Alert, oriented and cooperative. Patient is in no acute distress.  Skin: Skin is warm and dry. No rash noted.   Cardiovascular: Normal heart rate noted  Respiratory: Normal respiratory effort, no problems with respiration noted  Abdomen: Soft, gravid, appropriate for gestational age.  Pain/Pressure: Present     Pelvic: Cervical exam deferred        Extremities: Normal range of motion.  Edema: Trace  Mental Status: Normal mood and affect. Normal behavior. Normal judgment and thought content.   US  MFM FETAL BPP WO NON STRESS Result Date: 11/17/2023 ----------------------------------------------------------------------  OBSTETRICS REPORT                       (Signed Final 11/17/2023 03:29 pm) ---------------------------------------------------------------------- Patient Info  ID #:       [id number]                          D.O.B.:  [birth year] (38 yrs)(F)   Name:       Danielle Weiss                      Visit Date: 11/17/2023 02:49 pm ---------------------------------------------------------------------- Performed By  Attending:        Delora Smaller DO       Secondary Phy.:   [facility]                                                             for [facility]  Performed By:     Erminio Gentry            Address:          [address]                    RDMS                                                             [place], [place]  [address]  Referred By:      [facility]          Location:         [facility]                                [facility]                                                             [facility]                                                             [facility]  Ref. Address:     [address]                    [place], [place]                    [address] ---------------------------------------------------------------------- Orders  #  Description                           Code        Ordered By  1  US  MFM FETAL BPP WO NON               76819.01    RAVI [facility]     STRESS ----------------------------------------------------------------------  #  Order #                     Accession #                Episode #  1  [id number]                   [id number]                 [id number] ---------------------------------------------------------------------- Indications  Advanced maternal age multigravida 68+,        O45.523  third trimester (38)  Gestational diabetes in pregnancy, diet        O24.410  controlled  Advanced Paternal Age - Encounter for          Z31.5  procreative genetic counseling (51 yrs)- FOB  carrier silent carrier alpha thal  Genetic carrier (Hgb E, Alpha Thal [place])         Z14.8  [redacted] weeks gestation of pregnancy                Z3A.34  LR NIPS ---------------------------------------------------------------------- Fetal Evaluation  Num Of Fetuses:          1  Fetal Heart Rate(bpm):  131  Cardiac Activity:       Observed  Presentation:           Cephalic  Placenta:               Anterior  P. Cord Insertion:  Previously seen  Amniotic Fluid  AFI FV:      Within normal limits  AFI Sum(cm)     %Tile       Largest Pocket(cm)  7.78            4.5         5.16  RUQ(cm)       RLQ(cm)       LUQ(cm)        LLQ(cm)  5.16          0.78          1.12           0.72 ---------------------------------------------------------------------- Biophysical Evaluation  Amniotic F.V:   Within normal limits       F. Tone:        Observed  F. Movement:    Observed                   Score:          8/8  F. Breathing:   Observed ---------------------------------------------------------------------- Biometry  LV:        5.5  mm ---------------------------------------------------------------------- OB History  Blood Type:   A+  Gravidity:    4         Term:   2         SAB:   1  Living:       2 ---------------------------------------------------------------------- Gestational Age  LMP:           34w 3d        Date:  03/21/23                  EDD:   12/26/23  Best:          34w 3d     Det. By:  LMP  (03/21/23)          EDD:   12/26/23 ---------------------------------------------------------------------- Anatomy  Cranium:               Appears normal         Aortic Arch:            Previously seen  Cavum:                 Appears normal         Ductal Arch:            Previously seen  Ventricles:            Appears normal         Diaphragm:              Appears normal  Choroid Plexus:        Previously seen        Stomach:                Appears normal, left                                                                        sided  Cerebellum:            Previously seen        Abdomen:  Previously seen  Posterior Fossa:       Previously seen        Abdominal Wall:         Previously seen  Face:                  Orbits and profile     Cord Vessels:           Previously seen                          previously seen  Lips:                  Previously seen        Kidneys:                Appear normal  Thoracic:              Previously seen        Bladder:                Appears normal  Heart:                 Appears normal         Spine:                  Previously seen                         (4CH, axis, and                         situs)  RVOT:                  Previously seen        Upper Extremities:      Previously seen  LVOT:                  Previously seen        Lower Extremities:      Previously seen ---------------------------------------------------------------------- Cervix Uterus Adnexa  Cervix  Not visualized (advanced GA >24wks) ---------------------------------------------------------------------- Comments  Maternal Fetal medicine Consult  Danielle Weiss is a 38 y.o. H5E7987 at [redacted]w[redacted]d here for  ultrasound and consultation. Danielle Weiss is doing well  today with no acute concerns. Today we focused on the  following:  GDM: Blood sugars are all at goal with diet. Due to AMA > 40  she will continue antenatal testing with delivery in the 39th  week.  The patient had time to ask questions that were answered to  her satisfaction.  She verbalized understanding and agrees to  proceed with the plan below.  Sonographic findings  Single intrauterine pregnancy.  Fetal cardiac activity: Observed.  Presentation: Cephalic.  Interval fetal anatomy appears normal.  Fetal biometry shows the estimated fetal weight at the  percentile.  Amniotic fluid: Within normal limits.  MVP: 5.16 cm.  Placenta: Anterior.  BPP: 8/8.  There are limitations of prenatal ultrasound such as the  inability to detect certain abnormalities due to poor  visualization. Various factors such as fetal position,  gestational age and maternal body habitus may increase the  difficulty in visualizing the fetal anatomy.  Recommendations  -Growth in 2 weeks  -Antenatal testing weekly - to be done with OB providers office  -Delivery  around [redacted] weeks gestation ----------------------------------------------------------------------  Delora Smaller, DO Electronically Signed Final Report   11/17/2023 03:29 pm ----------------------------------------------------------------------   US  MFM OB FOLLOW UP Result Date: 11/03/2023 ----------------------------------------------------------------------  OBSTETRICS REPORT                       (Signed Final 11/03/2023 05:03 pm) ---------------------------------------------------------------------- Patient Info  ID #:       [id number]                          D.O.B.:  11-May-1985 (38 yrs)(F)  Name:       Danielle Weiss                      Visit Date: 11/03/2023 04:41 pm ---------------------------------------------------------------------- Performed By  Attending:        Steffan Keys MD         Ref. Address:     [address]                                                             [place], [place]                                                             [address]  Performed By:     Elenor Edu BS      Location[facility]                                 [facility]                                                             [facility]                                                             [facility]  Referred By:      [facility]                    for Women ---------------------------------------------------------------------- Orders  #  Description                           Code        Ordered By  1  US  MFM OB FOLLOW UP                   [id number]    CORENTHIAN  BOOKER ----------------------------------------------------------------------  #  Order #                     Accession #                Episode #  1  [id number]                   [id number]                 [id number] ---------------------------------------------------------------------- Indications  Advanced maternal age multigravida 46+,         O24.523  third trimester (38)  Gestational diabetes in pregnancy, diet        O24.410  controlled  Advanced Paternal Age - Encounter for          Z31.5  procreative genetic counseling (51 yrs)- FOB  carrier silent carrier alpha thal  [redacted] weeks gestation of pregnancy                Z3A.32  Genetic carrier (Hgb E, Alpha Thal [place])         Z14.8  LR NIPS  Antenatal follow-up for nonvisualized fetal    Z36.2  anatomy ---------------------------------------------------------------------- Fetal Evaluation  Num Of Fetuses:         1  Fetal Heart Rate(bpm):  144  Cardiac Activity:       Observed  Presentation:           Cephalic  Placenta:               Anterior  P. Cord Insertion:      Previously seen  Amniotic Fluid  AFI FV:      Within normal limits  AFI Sum(cm)     %Tile       Largest Pocket(cm)  13.84           46          4.38  RUQ(cm)       RLQ(cm)       LUQ(cm)        LLQ(cm)  2.48          3.44          3.54           4.38 ---------------------------------------------------------------------- Biometry  BPD:        78  mm     G. Age:  31w 2d         13  %    CI:        69.64   %    70 - 86                                                          FL/HC:      20.3   %    19.1 - 21.3  HC:      298.3  mm     G. Age:  33w 0d         28  %    HC/AC:      1.05        0.96 - 1.17  AC:      284.2  mm     G. Age:  32w 3d         50  %  FL/BPD:     77.6   %    71 - 87  FL:       60.5  mm     G. Age:  31w 3d         15  %    FL/AC:      21.3   %    20 - 24  LV:        5.2  mm  Est. FW:    1904  gm      4 lb 3 oz     30  % ---------------------------------------------------------------------- OB History  Blood Type:   A+  Gravidity:    4         Term:   2         SAB:   1  Living:       2 ---------------------------------------------------------------------- Gestational Age  LMP:           32w 3d        Date:  03/21/23                  EDD:   12/26/23  U/S Today:     32w 0d                                        EDD:    12/29/23  Best:          32w 3d     Det. By:  LMP  (03/21/23)          EDD:   12/26/23 ---------------------------------------------------------------------- Anatomy  Cranium:               Appears normal         Aortic Arch:            Previously seen  Cavum:                 Appears normal         Ductal Arch:            Previously seen  Ventricles:            Appears normal         Diaphragm:              Appears normal  Choroid Plexus:        Previously seen        Stomach:                Appears normal, left                                                                        sided  Cerebellum:            Previously seen        Abdomen:                Previously seen  Posterior Fossa:       Previously seen        Abdominal Wall:         Previously seen  Face:  Orbits and profile     Cord Vessels:           Previously seen                         previously seen  Lips:                  Previously seen        Kidneys:                Appear normal  Thoracic:              Previously seen        Bladder:                Appears normal  Heart:                 Appears normal         Spine:                  Previously seen                         (4CH, axis, and                         situs)  RVOT:                  Previously seen        Upper Extremities:      Previously seen  LVOT:                  Previously seen        Lower Extremities:      Previously seen ---------------------------------------------------------------------- Cervix Uterus Adnexa  Cervix  Not visualized (advanced GA >24wks)  Uterus  No abnormality visualized.  Right Ovary  Within normal limits.  Left Ovary  Within normal limits.  Cul De Sac  No free fluid seen.  Adnexa  No abnormality visualized ---------------------------------------------------------------------- Comments  Danielle Weiss is currently at 32 weeks and 3 days.  She has  been followed due to advanced maternal age (38 years old)  and diet-controlled gestational diabetes.   She denies any problems since her last exam and reports  that her fingerstick values have been within normal limits.  On today's exam, the overall EFW of 4 pounds 3 ounces  measures at the 30th percentile for her gestational age.  There was normal amniotic fluid noted with a total AFI of  13.84 cm.  Due to advanced maternal age and gestational diabetes, we  will start weekly fetal testing at 34 weeks.  She will return in 2 weeks for a BPP.  Delivery should occur at around 39 weeks.  The patient stated that all of her questions were answered  today.  A total of 20 minutes was spent counseling and coordinating  the care for this patient.  Greater than 50% of the time was  spent in direct face-to-face contact. ----------------------------------------------------------------------                   Steffan Keys, MD Electronically Signed Final Report   11/03/2023 05:03 pm ----------------------------------------------------------------------    Assessment and Plan:  Pregnancy: H5E7987 at [redacted]w[redacted]d 1. Diet controlled gestational diabetes mellitus (GDM) in third trimester (Primary) Reviewed blood sugars, all within range. Continue diet and exercise control.  2. Multigravida of advanced maternal age  in third trimester Already scheduled for growth scans and weekly antenatal testing, discussed need for delivery around 39 weeks.  3. Group B Streptococcus urinary tract infection affecting pregnancy in third trimester Will need intrapartum prophylaxis.  4. [redacted] weeks gestation of pregnancy 5. Supervision of high risk pregnancy, antepartum No other concerns. Preterm labor symptoms and general obstetric precautions including but not limited to vaginal bleeding, contractions, leaking of fluid and fetal movement were reviewed in detail with the patient. Please refer to After Visit Summary for other counseling recommendations.   Return in about 1 week (around 11/26/2023) for OB visits and antenatal testing as  scheduled.  Future Appointments  Date Time Provider Department Center  11/26/2023  8:55 AM [facility] US2 [facility]  12/01/2023  1:15 PM [facility] PROVIDER 1 [facility]  12/01/2023  1:30 PM [facility] US1 [facility]  12/03/2023  8:15 AM Cleatus Moccasin, MD [facility]  12/09/2023  8:15 AM Fredirick Glenys RAMAN, MD [facility]  12/09/2023  8:55 AM [facility] US2 [facility]  12/16/2023  8:15 AM Fredirick Glenys RAMAN, MD [facility]  12/16/2023  8:55 AM [facility] US2 [facility]    Gloris Hugger, MD

## 2023-11-19 NOTE — Patient Instructions (Signed)

## 2023-11-24 ENCOUNTER — Ambulatory Visit

## 2023-11-26 ENCOUNTER — Other Ambulatory Visit

## 2023-12-01 ENCOUNTER — Ambulatory Visit (HOSPITAL_BASED_OUTPATIENT_CLINIC_OR_DEPARTMENT_OTHER)

## 2023-12-01 ENCOUNTER — Ambulatory Visit: Attending: Obstetrics and Gynecology | Admitting: Maternal & Fetal Medicine

## 2023-12-01 VITALS — BP 108/82 | HR 83

## 2023-12-01 DIAGNOSIS — O24410 Gestational diabetes mellitus in pregnancy, diet controlled: Secondary | ICD-10-CM | POA: Diagnosis not present

## 2023-12-01 DIAGNOSIS — B951 Streptococcus, group B, as the cause of diseases classified elsewhere: Secondary | ICD-10-CM

## 2023-12-01 DIAGNOSIS — O09523 Supervision of elderly multigravida, third trimester: Secondary | ICD-10-CM | POA: Insufficient documentation

## 2023-12-01 DIAGNOSIS — Z315 Encounter for genetic counseling: Secondary | ICD-10-CM | POA: Insufficient documentation

## 2023-12-01 DIAGNOSIS — D563 Thalassemia minor: Secondary | ICD-10-CM

## 2023-12-01 DIAGNOSIS — O0990 Supervision of high risk pregnancy, unspecified, unspecified trimester: Secondary | ICD-10-CM

## 2023-12-01 DIAGNOSIS — Z603 Acculturation difficulty: Secondary | ICD-10-CM

## 2023-12-01 DIAGNOSIS — D582 Other hemoglobinopathies: Secondary | ICD-10-CM

## 2023-12-01 DIAGNOSIS — Z148 Genetic carrier of other disease: Secondary | ICD-10-CM | POA: Insufficient documentation

## 2023-12-01 DIAGNOSIS — O99013 Anemia complicating pregnancy, third trimester: Secondary | ICD-10-CM

## 2023-12-01 DIAGNOSIS — Z3A36 36 weeks gestation of pregnancy: Secondary | ICD-10-CM | POA: Insufficient documentation

## 2023-12-01 NOTE — Progress Notes (Signed)
 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 H5E7987 at [redacted]w[redacted]d here for ultrasound and consultation. Tamaira Carstarphen is doing well today with no acute concerns. Today we focused on the following:   GDMA1: good control w/ diet alone. Normal fetal wegith.   AMA> 40: Discussed delivery timing to be around 39 week.  The patient expressed her desire to avoid induction if possible.  I discussed the increased risk of stillbirth beyond the EDD and a pregnancy complicated by advanced maternal age over the age of 41.  She verbalized understanding and is open to induction if necessary.  Alternatively she could try to go to around her due date as long as antenatal testing is reassuring.   The patient had time to ask questions that were answered to her satisfaction.  She verbalized understanding and agrees to proceed with the plan below.  Sonographic findings Single intrauterine pregnancy. Fetal cardiac activity: Observed. Presentation: Cephalic. Interval fetal anatomy appears normal. Fetal biometry shows the estimated fetal weight at the 15 percentile. Amniotic fluid: Within normal limits.  MVP: 3.89 cm. Placenta: Anterior. BPP: 8/8.   There are limitations of prenatal ultrasound such as the inability to detect certain abnormalities due to poor visualization. Various factors such  as fetal position, gestational age and maternal body habitus may increase the difficulty in visualizing the fetal anatomy.    Recommendations -Serial growth ultrasounds every 4-6 weeks until delivery -Continue weekly antenatal testing -Delivery around 39-[redacted] weeks gestation  Review of Systems: A review of systems was performed and was negative except per HPI   Vitals and Physical Exam    12/01/2023    1:17 PM 11/19/2023    8:27 AM 11/17/2023    2:39 PM  Vitals with BMI  Weight  123 lbs 6 oz   Systolic 108 108 890  Diastolic 82 74 74  Pulse 83 79 71    Sitting comfortably on the sonogram table Nonlabored breathing Normal rate and rhythm Abdomen is nontender  Past pregnancies OB History  Gravida Para Term Preterm AB Living  4 2 2  1 2   SAB IAB Ectopic Multiple Live Births  1    2    # Outcome Date GA Lbr Len/2nd Weight Sex Type Anes PTL Lv  4 Current           3 SAB 10/2020 [redacted]w[redacted]d         2 Term 04/30/10 [redacted]w[redacted]d  7 lb (3.175 kg) M Vag-Spont   LIV  1 Term 05/03/06 [redacted]w[redacted]d  6 lb (2.722 kg) F Vag-Spont   LIV     I spent 20 minutes reviewing the patients chart, including labs and images as well as counseling the patient about her medical conditions. Greater than 50% of the time was spent in direct face-to-face patient counseling.  Delora Smaller  MFM, [facility]   12/01/2023  2:07 PM

## 2023-12-03 ENCOUNTER — Ambulatory Visit: Admitting: Obstetrics and Gynecology

## 2023-12-03 ENCOUNTER — Other Ambulatory Visit (HOSPITAL_COMMUNITY)
Admission: RE | Admit: 2023-12-03 | Discharge: 2023-12-03 | Disposition: A | Discharge status: Home / Self Care | Source: Ambulatory Visit | Attending: Obstetrics and Gynecology | Admitting: Obstetrics and Gynecology

## 2023-12-03 ENCOUNTER — Other Ambulatory Visit: Payer: Self-pay

## 2023-12-03 VITALS — BP 122/81 | HR 81 | Wt 126.5 lb

## 2023-12-03 DIAGNOSIS — O24410 Gestational diabetes mellitus in pregnancy, diet controlled: Secondary | ICD-10-CM | POA: Diagnosis not present

## 2023-12-03 DIAGNOSIS — Z758 Other problems related to medical facilities and other health care: Secondary | ICD-10-CM

## 2023-12-03 DIAGNOSIS — Z603 Acculturation difficulty: Secondary | ICD-10-CM

## 2023-12-03 DIAGNOSIS — O0993 Supervision of high risk pregnancy, unspecified, third trimester: Secondary | ICD-10-CM | POA: Diagnosis not present

## 2023-12-03 DIAGNOSIS — Z3A36 36 weeks gestation of pregnancy: Secondary | ICD-10-CM | POA: Diagnosis not present

## 2023-12-03 DIAGNOSIS — O2343 Unspecified infection of urinary tract in pregnancy, third trimester: Secondary | ICD-10-CM | POA: Diagnosis not present

## 2023-12-03 DIAGNOSIS — O09523 Supervision of elderly multigravida, third trimester: Secondary | ICD-10-CM | POA: Diagnosis not present

## 2023-12-03 DIAGNOSIS — N898 Other specified noninflammatory disorders of vagina: Secondary | ICD-10-CM | POA: Diagnosis present

## 2023-12-03 DIAGNOSIS — B951 Streptococcus, group B, as the cause of diseases classified elsewhere: Secondary | ICD-10-CM

## 2023-12-03 DIAGNOSIS — O0990 Supervision of high risk pregnancy, unspecified, unspecified trimester: Secondary | ICD-10-CM

## 2023-12-03 NOTE — Progress Notes (Signed)
   PRENATAL VISIT NOTE  Subjective:  Danielle Weiss is a 38 y.o. H5E7987 at [redacted]w[redacted]d being seen today for ongoing prenatal care.  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  Patient reports no complaints.  Contractions: Not present. Vag. Bleeding: None.  Movement: Present. Denies leaking of fluid.   The following portions of the patient's history were reviewed and updated as appropriate: allergies, current medications, past family history, past medical history, past social history, past surgical history and problem list.   Objective:    Vitals:   12/03/23 0842  BP: 122/81  Pulse: 81  Weight: 126 lb 8 oz (57.4 kg)    Fetal Status:  Fetal Heart Rate (bpm): 145   Movement: Present    General: Alert, oriented and cooperative. Patient is in no acute distress.  Skin: Skin is warm and dry. No rash noted.   Cardiovascular: Normal heart rate noted  Respiratory: Normal respiratory effort, no problems with respiration noted  Abdomen: Soft, gravid, appropriate for gestational age.  Pain/Pressure: Absent     Pelvic: Cervical exam performed in the presence of a chaperone        Extremities: Normal range of motion.  Edema: None  Mental Status: Normal mood and affect. Normal behavior. Normal judgment and thought content.     Assessment and Plan:  Pregnancy: H5E7987 at [redacted]w[redacted]d 1. Supervision of high risk pregnancy, antepartum (Primary) Routine cx done. GBS pos.  - Cervicovaginal ancillary only( [employer])  2. Pregnancy with 36 completed weeks gestation   3. Diet controlled gestational diabetes mellitus (GDM) in third trimester Current regimen:  Diet CBG review: See above. WNL Regimen changes: none Growth US : 8/19: 15%ile, normal afi, normal AC Antenatal monitoring: Continue weekly testing Other needs: None   4. Group B Streptococcus urinary tract infection affecting pregnancy in third trimester PCN in labor  5. Language barrier Pt speaks English.   6. Multigravida of advanced maternal age in third trimester Recommend IOL at 39w. The patient declines induction. We discussed the risk of stillbirth. We discussed the process of induction and how we can work around her wishes for delivery. She still declines following this discussion. Reviewed we would continue to discuss at upcoming appointments.   Preterm labor symptoms and general obstetric precautions including but not limited to vaginal bleeding, contractions, leaking of fluid and fetal movement were reviewed in detail with the patient. Please refer to After Visit Summary for other counseling recommendations.   No follow-ups on file.  Future Appointments  Date Time Provider Department Center  12/09/2023  8:15 AM Fredirick Glenys RAMAN, MD [facility]  12/09/2023  8:55 AM [facility] US2 [facility]  12/16/2023  8:15 AM Fredirick Glenys RAMAN, MD [facility]  12/16/2023  8:55 AM [facility] US2 [facility]  12/23/2023  8:35 AM Ilean Norleen GAILS, MD [facility]  12/30/2023  3:55 PM Zina Jerilynn LABOR, MD [facility]    Vina Solian, MD

## 2023-12-04 LAB — CERVICOVAGINAL ANCILLARY ONLY
Bacterial Vaginitis (gardnerella): POSITIVE — AB
Candida Glabrata: NEGATIVE
Candida Vaginitis: POSITIVE — AB
Chlamydia: NEGATIVE
Comment: NEGATIVE
Comment: NEGATIVE
Comment: NEGATIVE
Comment: NEGATIVE
Comment: NEGATIVE
Comment: NORMAL
Neisseria Gonorrhea: NEGATIVE
Trichomonas: NEGATIVE

## 2023-12-05 ENCOUNTER — Ambulatory Visit: Payer: Self-pay | Admitting: Obstetrics and Gynecology

## 2023-12-05 DIAGNOSIS — B3731 Acute candidiasis of vulva and vagina: Secondary | ICD-10-CM

## 2023-12-05 DIAGNOSIS — N760 Acute vaginitis: Secondary | ICD-10-CM

## 2023-12-05 MED ORDER — TERCONAZOLE 0.4 % VA CREA
1.0000 | TOPICAL_CREAM | Freq: Every day | VAGINAL | 0 refills | Status: DC
Start: 2023-12-05 — End: 2023-12-07

## 2023-12-05 MED ORDER — METRONIDAZOLE 500 MG PO TABS: 500.0000 mg | ORAL_TABLET | Freq: Two times a day (BID) | ORAL | 0 refills | Status: DC

## 2023-12-07 ENCOUNTER — Inpatient Hospital Stay (HOSPITAL_COMMUNITY): Admitting: Anesthesiology

## 2023-12-07 ENCOUNTER — Inpatient Hospital Stay (HOSPITAL_COMMUNITY)
Admission: AD | Admit: 2023-12-07 | Discharge: 2023-12-08 | DRG: 807 | Disposition: A | Discharge status: Home / Self Care | Attending: Family Medicine | Admitting: Family Medicine

## 2023-12-07 ENCOUNTER — Other Ambulatory Visit: Payer: Self-pay

## 2023-12-07 ENCOUNTER — Encounter (HOSPITAL_COMMUNITY): Payer: Self-pay | Admitting: Obstetrics and Gynecology

## 2023-12-07 DIAGNOSIS — O26893 Other specified pregnancy related conditions, third trimester: Secondary | ICD-10-CM | POA: Diagnosis present

## 2023-12-07 DIAGNOSIS — O9902 Anemia complicating childbirth: Secondary | ICD-10-CM | POA: Diagnosis present

## 2023-12-07 DIAGNOSIS — O24420 Gestational diabetes mellitus in childbirth, diet controlled: Principal | ICD-10-CM | POA: Diagnosis present

## 2023-12-07 DIAGNOSIS — Z3A37 37 weeks gestation of pregnancy: Secondary | ICD-10-CM

## 2023-12-07 DIAGNOSIS — O99820 Streptococcus B carrier state complicating pregnancy: Secondary | ICD-10-CM

## 2023-12-07 DIAGNOSIS — Z8249 Family history of ischemic heart disease and other diseases of the circulatory system: Secondary | ICD-10-CM

## 2023-12-07 DIAGNOSIS — O24410 Gestational diabetes mellitus in pregnancy, diet controlled: Secondary | ICD-10-CM

## 2023-12-07 DIAGNOSIS — D563 Thalassemia minor: Secondary | ICD-10-CM | POA: Diagnosis present

## 2023-12-07 DIAGNOSIS — Z148 Genetic carrier of other disease: Secondary | ICD-10-CM

## 2023-12-07 DIAGNOSIS — O24419 Gestational diabetes mellitus in pregnancy, unspecified control: Secondary | ICD-10-CM | POA: Diagnosis present

## 2023-12-07 DIAGNOSIS — O09523 Supervision of elderly multigravida, third trimester: Secondary | ICD-10-CM

## 2023-12-07 DIAGNOSIS — O09529 Supervision of elderly multigravida, unspecified trimester: Secondary | ICD-10-CM

## 2023-12-07 DIAGNOSIS — Z7982 Long term (current) use of aspirin: Secondary | ICD-10-CM | POA: Diagnosis not present

## 2023-12-07 DIAGNOSIS — Z30017 Encounter for initial prescription of implantable subdermal contraceptive: Secondary | ICD-10-CM | POA: Diagnosis not present

## 2023-12-07 DIAGNOSIS — Z603 Acculturation difficulty: Secondary | ICD-10-CM

## 2023-12-07 DIAGNOSIS — B951 Streptococcus, group B, as the cause of diseases classified elsewhere: Secondary | ICD-10-CM | POA: Diagnosis present

## 2023-12-07 DIAGNOSIS — O0990 Supervision of high risk pregnancy, unspecified, unspecified trimester: Principal | ICD-10-CM

## 2023-12-07 DIAGNOSIS — O99824 Streptococcus B carrier state complicating childbirth: Secondary | ICD-10-CM | POA: Diagnosis present

## 2023-12-07 DIAGNOSIS — D582 Other hemoglobinopathies: Secondary | ICD-10-CM

## 2023-12-07 DIAGNOSIS — O4202 Full-term premature rupture of membranes, onset of labor within 24 hours of rupture: Secondary | ICD-10-CM

## 2023-12-07 LAB — CBC
HCT: 35.5 % — ABNORMAL LOW (ref 36.0–46.0)
Hemoglobin: 11.9 g/dL — ABNORMAL LOW (ref 12.0–15.0)
MCH: 28.8 pg (ref 26.0–34.0)
MCHC: 33.5 g/dL (ref 30.0–36.0)
MCV: 86 fL (ref 80.0–100.0)
Platelets: 278 K/uL (ref 150–400)
RBC: 4.13 MIL/uL (ref 3.87–5.11)
RDW: 14.4 % (ref 11.5–15.5)
WBC: 9.6 K/uL (ref 4.0–10.5)
nRBC: 0 % (ref 0.0–0.2)

## 2023-12-07 LAB — GLUCOSE, CAPILLARY: Glucose-Capillary: 92 mg/dL (ref 70–99)

## 2023-12-07 LAB — TYPE AND SCREEN
ABO/RH(D): A POS
Antibody Screen: NEGATIVE

## 2023-12-07 LAB — RPR: RPR Ser Ql: NONREACTIVE

## 2023-12-07 LAB — POCT FERN TEST: POCT Fern Test: POSITIVE

## 2023-12-07 MED ORDER — SENNOSIDES-DOCUSATE SODIUM 8.6-50 MG PO TABS
2.0000 | ORAL_TABLET | Freq: Every day | ORAL | Status: DC
  Administered 2023-12-08: 2 via ORAL
  Filled 2023-12-07: qty 2

## 2023-12-07 MED ORDER — ACETAMINOPHEN 325 MG PO TABS
650.0000 mg | ORAL_TABLET | ORAL | Status: DC | PRN
Start: 2023-12-07 — End: 2023-12-08

## 2023-12-07 MED ORDER — IBUPROFEN 600 MG PO TABS
600.0000 mg | ORAL_TABLET | Freq: Four times a day (QID) | ORAL | Status: DC
  Administered 2023-12-07 – 2023-12-08 (×4): 600 mg via ORAL
  Filled 2023-12-07 (×5): qty 1

## 2023-12-07 MED ORDER — DIBUCAINE (PERIANAL) 1 % EX OINT
1.0000 | TOPICAL_OINTMENT | CUTANEOUS | Status: DC | PRN
Start: 2023-12-07 — End: 2023-12-08

## 2023-12-07 MED ORDER — SOD CITRATE-CITRIC ACID 500-334 MG/5ML PO SOLN: 30.0000 mL | ORAL | Status: DC | PRN

## 2023-12-07 MED ORDER — SODIUM CHLORIDE 0.9 % IV SOLN
5.0000 10*6.[IU] | Freq: Once | INTRAVENOUS | Status: AC
  Administered 2023-12-07: 5 10*6.[IU] via INTRAVENOUS
  Filled 2023-12-07: qty 5

## 2023-12-07 MED ORDER — OXYCODONE-ACETAMINOPHEN 5-325 MG PO TABS: 1.0000 | ORAL_TABLET | ORAL | Status: DC | PRN

## 2023-12-07 MED ORDER — LIDOCAINE HCL (PF) 1 % IJ SOLN
INTRAMUSCULAR | Status: DC | PRN
  Administered 2023-12-07 (×2): 4 mL via EPIDURAL

## 2023-12-07 MED ORDER — OXYTOCIN-SODIUM CHLORIDE 30-0.9 UT/500ML-% IV SOLN
1.0000 m[IU]/min | INTRAVENOUS | Status: DC
  Administered 2023-12-07: 2 m[IU]/min via INTRAVENOUS
  Filled 2023-12-07: qty 500

## 2023-12-07 MED ORDER — PHENYLEPHRINE 80 MCG/ML (10ML) SYRINGE FOR IV PUSH (FOR BLOOD PRESSURE SUPPORT): 80.0000 ug | PREFILLED_SYRINGE | INTRAVENOUS | Status: DC | PRN

## 2023-12-07 MED ORDER — BENZOCAINE-MENTHOL 20-0.5 % EX AERO: 1.0000 | INHALATION_SPRAY | CUTANEOUS | Status: DC | PRN

## 2023-12-07 MED ORDER — FLEET ENEMA RE ENEM
1.0000 | ENEMA | RECTAL | Status: DC | PRN
Start: 2023-12-07 — End: 2023-12-07

## 2023-12-07 MED ORDER — COCONUT OIL OIL: 1.0000 | TOPICAL_OIL | Status: DC | PRN

## 2023-12-07 MED ORDER — OXYCODONE-ACETAMINOPHEN 5-325 MG PO TABS: 2.0000 | ORAL_TABLET | ORAL | Status: DC | PRN

## 2023-12-07 MED ORDER — FENTANYL-BUPIVACAINE-NACL 0.5-0.125-0.9 MG/250ML-% EP SOLN
12.0000 mL/h | EPIDURAL | Status: DC | PRN
  Administered 2023-12-07: 10 mL/h via EPIDURAL
  Filled 2023-12-07: qty 250

## 2023-12-07 MED ORDER — SIMETHICONE 80 MG PO CHEW: 80.0000 mg | CHEWABLE_TABLET | ORAL | Status: DC | PRN

## 2023-12-07 MED ORDER — OXYCODONE HCL 5 MG PO TABS: 10.0000 mg | ORAL_TABLET | ORAL | Status: DC | PRN

## 2023-12-07 MED ORDER — EPHEDRINE 5 MG/ML INJ: 10.0000 mg | INTRAVENOUS | Status: DC | PRN

## 2023-12-07 MED ORDER — OXYCODONE HCL 5 MG PO TABS: 5.0000 mg | ORAL_TABLET | ORAL | Status: DC | PRN

## 2023-12-07 MED ORDER — OXYTOCIN-SODIUM CHLORIDE 30-0.9 UT/500ML-% IV SOLN: 2.5000 [IU]/h | INTRAVENOUS | Status: DC

## 2023-12-07 MED ORDER — LACTATED RINGERS IV SOLN: 500.0000 mL | INTRAVENOUS | Status: DC | PRN

## 2023-12-07 MED ORDER — LACTATED RINGERS IV SOLN: 500.0000 mL | Freq: Once | INTRAVENOUS | Status: DC

## 2023-12-07 MED ORDER — ONDANSETRON HCL 4 MG PO TABS: 4.0000 mg | ORAL_TABLET | ORAL | Status: DC | PRN

## 2023-12-07 MED ORDER — PENICILLIN G POT IN DEXTROSE 60000 UNIT/ML IV SOLN: 3.0000 10*6.[IU] | INTRAVENOUS | Status: DC

## 2023-12-07 MED ORDER — ZOLPIDEM TARTRATE 5 MG PO TABS: 5.0000 mg | ORAL_TABLET | Freq: Every evening | ORAL | Status: DC | PRN

## 2023-12-07 MED ORDER — PRENATAL MULTIVITAMIN CH
1.0000 | ORAL_TABLET | Freq: Every day | ORAL | Status: DC
  Administered 2023-12-08: 1 via ORAL
  Filled 2023-12-07: qty 1

## 2023-12-07 MED ORDER — ONDANSETRON HCL 4 MG/2ML IJ SOLN: 4.0000 mg | INTRAMUSCULAR | Status: DC | PRN

## 2023-12-07 MED ORDER — ONDANSETRON HCL 4 MG/2ML IJ SOLN: 4.0000 mg | Freq: Four times a day (QID) | INTRAMUSCULAR | Status: DC | PRN

## 2023-12-07 MED ORDER — LIDOCAINE HCL (PF) 1 % IJ SOLN
30.0000 mL | INTRAMUSCULAR | Status: DC | PRN
Start: 2023-12-07 — End: 2023-12-07

## 2023-12-07 MED ORDER — DIPHENHYDRAMINE HCL 25 MG PO CAPS: 25.0000 mg | ORAL_CAPSULE | Freq: Four times a day (QID) | ORAL | Status: DC | PRN

## 2023-12-07 MED ORDER — LACTATED RINGERS IV SOLN: INTRAVENOUS | Status: DC

## 2023-12-07 MED ORDER — ACETAMINOPHEN 325 MG PO TABS
650.0000 mg | ORAL_TABLET | ORAL | Status: DC | PRN
Start: 2023-12-07 — End: 2023-12-07

## 2023-12-07 MED ORDER — WITCH HAZEL-GLYCERIN EX PADS: 1.0000 | MEDICATED_PAD | CUTANEOUS | Status: DC | PRN

## 2023-12-07 MED ORDER — FENTANYL CITRATE (PF) 100 MCG/2ML IJ SOLN
50.0000 ug | INTRAMUSCULAR | Status: DC | PRN
  Administered 2023-12-07 (×2): 100 ug via INTRAVENOUS
  Filled 2023-12-07 (×2): qty 2

## 2023-12-07 MED ORDER — OXYTOCIN BOLUS FROM INFUSION: 333.0000 mL | Freq: Once | INTRAVENOUS | Status: DC

## 2023-12-07 MED ORDER — TERBUTALINE SULFATE 1 MG/ML IJ SOLN: 0.2500 mg | Freq: Once | INTRAMUSCULAR | Status: DC | PRN

## 2023-12-07 MED ORDER — DIPHENHYDRAMINE HCL 50 MG/ML IJ SOLN: 12.5000 mg | INTRAMUSCULAR | Status: DC | PRN

## 2023-12-07 NOTE — Inpatient Diabetes Management (Signed)
 Inpatient Diabetes Program Recommendations  ADA Standards of Care  Diabetes in Pregnancy Target Glucose Ranges:  Fasting: 70 - 95 mg/dL 1 hr postprandial:  889 - 140mg /dL (from first bite of meal) 2 hr postprandial:  100 - 120 mg/dL (from first bit of meal)    Lab Results  Component Value Date   GLUCAP 92 12/07/2023    Latest Reference Range & Units 12/07/23 07:52  Glucose-Capillary 70 - 99 mg/dL 92    Diabetes history: GD Outpatient Diabetes medications: None Current orders for Inpatient glycemic control: CBGs  Inpatient Diabetes Program Recommendations:   Admitted and now started on Pitocin  drip. CBGs ordered. Will follow during hospitalization.  Thank you, Florena Kozma E. Emaline Karnes, RN, MSN, CDCES  Diabetes Coordinator Inpatient Glycemic Control Team Team Pager [phone] (8am-5pm) 12/07/2023 9:48 AM

## 2023-12-07 NOTE — Lactation Note (Signed)
 This note was copied from a baby's chart. Lactation Consultation Note  Patient Name: Danielle Weiss Date: 12/07/2023 Age:40 hours Reason for consult: Initial assessment;Early term 37-38.6wks;Maternal endocrine disorder  P3- MOB's feeding plan is formula and breast milk. MOB reports that she would like assistance with formula feeding infant for this feeding because he was tired. LC placed infant in a side lying position and pace fed him 14 mL of formula. Infant tolerated the feeding well. LC reviewed the first 24 hr birthday nap, day 2 cluster feeding, feeding infant on cue 8-12x in 24 hrs, not allowing infant to go over 3  hrs without a feeding, CDC milk storage guidelines, LC services handout and engorgement/breast care. LC encouraged MOB to all for further assistance as needed.  Maternal Data Has patient been taught Hand Expression?: No Does the patient have breastfeeding experience prior to this delivery?: Yes How long did the patient breastfeed?: first child formula only, second child nursed for 3 years  Feeding Mother's Current Feeding Choice: Breast Milk and Formula Nipple Type: Slow - flow  LATCH Score Latch: Grasps breast easily, tongue down, lips flanged, rhythmical sucking.  Audible Swallowing: Spontaneous and intermittent  Type of Nipple: Everted at rest and after stimulation  Comfort (Breast/Nipple): Soft / non-tender  Hold (Positioning): No assistance needed to correctly position infant at breast.  LATCH Score: 10   Lactation Tools Discussed/Used Pump Education: Milk Storage  Interventions Interventions: Breast feeding basics reviewed;Education;Pace feeding;LC Services brochure  Discharge Discharge Education: Engorgement and breast care;Warning signs for feeding baby Pump: Referral sent for [facility] Pump  Consult Status Consult Status: Follow-up Date: 12/08/23 Follow-up type: In-patient    Recardo Hoit BS, IBCLC 12/07/2023, 3:37 PM

## 2023-12-07 NOTE — Procedures (Signed)
 LABOR COURSE 38 y.o. female [phone] with IUP at [redacted]w[redacted]d by LMP c/w US  at 7wk admitted for SROM. Labor augmented with pitocin  and epidural was placed.  Delivery Note Called to room and patient was complete and pushing. Head delivered LOA. One nuchal cord present and reduced prior to delivery of body. Shoulder and body delivered in usual fashion. At  1043 a viable female was delivered via Vaginal, Spontaneous (Presentation: vertex).  Infant with spontaneous cry, placed on mother's abdomen, dried and stimulated. Cord clamped x 2 after >2-minute delay, and cut by FOB. Cord blood drawn. Placenta delivered spontaneously with gentle cord traction. Appears intact. Fundus firm with massage and Pitocin . Labia, perineum, vagina, and cervix inspected with 2nd degree hemostatic laceration noted.    APGAR: 9, 10; 2722g Cord: 3VC with the following complications: one accessory lobe was noted.    Anesthesia:  epidural Episiotomy: None Lacerations: 2nd degree perineal laceration Suture Repair: 3.0 vicryl rapide Est. Blood Loss (mL):  Mom to postpartum.  Baby to Couplet care / Skin to Skin.  Lona Merritts, MD @TODAY @ 11:40 AM

## 2023-12-07 NOTE — Anesthesia Procedure Notes (Signed)
 Epidural Patient location during procedure: OB Start time: 12/07/2023 8:39 AM End time: 12/07/2023 8:41 AM  Staffing Anesthesiologist: Lucious Debby BRAVO, MD Performed: anesthesiologist   Preanesthetic Checklist Completed: patient identified, IV checked, risks and benefits discussed, monitors and equipment checked, pre-op evaluation and timeout performed  Epidural Patient position: sitting Prep: DuraPrep Patient monitoring: continuous pulse ox and blood pressure Approach: midline Location: L2-L3 Injection technique: LOR saline  Needle:  Needle type: Tuohy  Needle gauge: 17 G Needle length: 9 cm Needle insertion depth: 4 cm Catheter size: 19 Gauge Catheter at skin depth: 8 cm Test dose: negative and Other (1% lidocaine )  Assessment Events: blood not aspirated and no cerebrospinal fluid  Additional Notes Patient identified. Risks including, but not limited to, bleeding, infection, nerve damage, paralysis, inadequate analgesia, blood pressure changes, nausea, vomiting, allergic reaction, postpartum back pain, itching, and headache were discussed. Patient expressed understanding and wished to proceed. Sterile prep and drape, including hand hygiene, mask, and sterile gloves were used. The patient was positioned and the spine was prepped. The skin was anesthetized with lidocaine . No paraesthesia or other complication noted. The patient did not experience any signs of intravascular injection such as tinnitus or metallic taste in mouth, nor signs of intrathecal spread such as rapid motor block. Please see nursing notes for vital signs. The patient tolerated the procedure well.   Debby Lucious, MDReason for block:procedure for pain

## 2023-12-07 NOTE — Anesthesia Preprocedure Evaluation (Addendum)
 Anesthesia Evaluation  Patient identified by MRN, date of birth, ID band Patient awake    Reviewed: Allergy & Precautions, NPO status , Patient's Chart, lab work & pertinent test results  History of Anesthesia Complications Negative for: history of anesthetic complications  Airway Mallampati: II   Neck ROM: Full    Dental   Pulmonary neg pulmonary ROS   Pulmonary exam normal        Cardiovascular negative cardio ROS Normal cardiovascular exam     Neuro/Psych  Questionable seizure history 1 time as an adult, long time ago - no antiepileptics, doesn't recall seeing neurologist   negative psych ROS   GI/Hepatic negative GI ROS, Neg liver ROS,,,  Endo/Other  diabetes, Well Controlled, Gestational    Renal/GU negative Renal ROS     Musculoskeletal negative musculoskeletal ROS (+)    Abdominal   Peds  Hematology  (+) Blood dyscrasia, anemia  Plt 278k Alpha thalassemia silent carrier 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    Anesthesia Other Findings Angioedema   Reproductive/Obstetrics                              Anesthesia Physical Anesthesia Plan  ASA: 2  Anesthesia Plan: Epidural   Post-op Pain Management: Minimal or no pain anticipated   Induction:   PONV Risk Score and Plan: 2 and Treatment may vary due to age or medical condition  Airway Management Planned: Natural Airway  Additional Equipment: None  Intra-op Plan:   Post-operative Plan:   Informed Consent: I have reviewed the patients History and Physical, chart, labs and discussed the procedure including the risks, benefits and alternatives for the proposed anesthesia with the patient or authorized representative who has indicated his/her understanding and acceptance.       Plan Discussed with: Anesthesiologist  Anesthesia Plan Comments: (Labs reviewed. Platelets acceptable, patient not taking any blood thinning medications.  Per RN, FHR tracing reported to be stable enough for sitting procedure. Risks and benefits discussed with patient, including PDPH, backache, epidural hematoma, failed epidural, blood pressure changes, allergic reaction, and nerve injury. Patient expressed understanding and wished to proceed.)         Anesthesia Quick Evaluation

## 2023-12-07 NOTE — MAU Note (Signed)

## 2023-12-07 NOTE — H&P (Cosign Needed Addendum)
 OBSTETRIC ADMISSION HISTORY AND PHYSICAL  Danielle Weiss is a 38 y.o. female [phone] with IUP at [redacted]w[redacted]d by LMP c/w US  at 7wk presenting for SROM at 0000. Pt with leakage of fluid and positive fern test. She reports +FMs, no VB, no blurry vision, headaches or peripheral edema, and RUQ pain.  She plans on breast and bottle feeding. She request IUD vs nexplanon  for birth control. She received her prenatal care at [facility]   Dating: By  LMP c/w US  at 8 7.6wk --->  Estimated Date of Delivery: 12/26/23  Sono:    @[redacted]w[redacted]d , CWD, normal anatomy, cephalic presentation, anterior placental lie, 4lb3oz 30% EFW   Prenatal History/Complications: A1GDM, AMA, GBS +, Alpha thalassemia silent carrier  Past Medical History: Past Medical History:  Diagnosis Date   Angioedema 07/10/2015   GDM (gestational diabetes mellitus) 05/2023   History of food allergy 07/10/2015   Medical history non-contributory    Recurrent urticaria 07/10/2015   SAB (spontaneous abortion) 11/05/2020    Past Surgical History: Past Surgical History:  Procedure Laterality Date   NO PAST SURGERIES      Obstetrical History: OB History     Gravida  4   Para  2   Term  2   Preterm      AB  1   Living  2      SAB  1   IAB      Ectopic      Multiple      Live Births  2           Social History Social History   Socioeconomic History   Marital status: Married    Spouse name: Not on file   Number of children: Not on file   Years of education: Not on file   Highest education level: Not on file  Occupational History   Not on file  Tobacco Use   Smoking status: Never   Smokeless tobacco: Never  Vaping Use   Vaping status: Never Used  Substance and Sexual Activity   Alcohol use: No    Alcohol/week: 0.0 standard drinks of alcohol   Drug use: No   Sexual activity: Yes    Birth control/protection: Implant  Other Topics Concern   Not on file  Social History Narrative   Lives with husband and Child. Works at  [employer] part time.         Social Drivers of Corporate investment banker Strain: Not on file  Food Insecurity: No Food Insecurity (12/07/2023)   Hunger Vital Sign    Worried About Running Out of Food in the Last Year: Never true    Ran Out of Food in the Last Year: Never true  Transportation Needs: No Transportation Needs (12/07/2023)   PRAPARE - Administrator, Civil Service (Medical): No    Lack of Transportation (Non-Medical): No  Physical Activity: Not on file  Stress: Not on file  Social Connections: Not on file    Family History: Family History  Problem Relation Age of Onset   Hypertension Mother    Allergic rhinitis Neg Hx    Angioedema Neg Hx    Asthma Neg Hx    Atopy Neg Hx    Eczema Neg Hx    Immunodeficiency Neg Hx    Urticaria Neg Hx    Cancer Neg Hx    Diabetes Neg Hx     Allergies: No Known Allergies  Medications Prior to Admission  Medication Sig  Dispense Refill Last Dose/Taking   Accu-Chek Softclix Lancets lancets Use as instructed 100 each 12 12/07/2023 Morning   aspirin  EC 81 MG tablet Take 1 tablet (81 mg total) by mouth daily. Swallow whole. 30 tablet 12 12/06/2023   glucose blood (ACCU-CHEK GUIDE TEST) test strip 1 each by Other route 4 (four) times daily. Use as instructed 100 each 12 12/07/2023 Morning   Prenatal Vit-Fe Fumarate-FA (MULTIVITAMIN-PRENATAL) 27-0.8 MG TABS tablet Take 1 tablet by mouth daily at 12 noon.   12/05/2023   EPINEPHrine  (EPIPEN  2-PAK) 0.3 mg/0.3 mL IJ SOAJ injection Inject 0.3 mLs (0.3 mg total) into the muscle once. 1 Device 1    metroNIDAZOLE  (FLAGYL ) 500 MG tablet Take 1 tablet (500 mg total) by mouth 2 (two) times daily. (Patient not taking: Reported on 12/07/2023) 14 tablet 0 Not Taking   terconazole  (TERAZOL 7 ) 0.4 % vaginal cream Place 1 applicator vaginally at bedtime. (Patient not taking: Reported on 12/07/2023) 45 g 0 Not Taking     Review of Systems   All systems reviewed and negative except as stated  in HPI  Blood pressure 118/82, pulse 81, temperature 98 F (36.7 C), temperature source Oral, resp. rate 16, height 4' 10 (1.473 m), weight 58.1 kg, last menstrual period 03/21/2023, SpO2 99%. General appearance: alert, cooperative, and no distress Lungs: clear to auscultation bilaterally Heart: regular rate and rhythm Abdomen: soft, non-tender; bowel sounds normal Pelvic: Cervix is 4/80/-2 Extremities: Homans sign is negative, no sign of DVT Presentation: cephalic Fetal monitoringBaseline: 135 bpm, Variability: Good {> 6 bpm), Accelerations: Reactive, and Decelerations: Absent Uterine activityFrequency: occasionally, Duration: 80-90 seconds, and Intensity: mild     Prenatal labs: ABO, Rh: --/--/A POS (08/25 0210) Antibody: NEG (08/25 0210) Rubella: Immune (02/17 0000) RPR: Non Reactive (06/10 0912)  HBsAg: Negative (02/17 0000)  HIV: Non Reactive (06/10 0912)  GBS:     No results found for: GBS GTT Elevated 1hr GTT Genetic screening  Low risk Anatomy US  normal  Immunization History  Administered Date(s) Administered   Influenza Whole 01/10/2010   Tdap 09/22/2023    Prenatal Transfer Tool  Maternal Diabetes: Yes:  Diabetes Type:  Diet controlled Genetic Screening: Normal Maternal Ultrasounds/Referrals: Normal Fetal Ultrasounds or other Referrals:  None Maternal Substance Abuse:  No Significant Maternal Medications:  None Significant Maternal Lab Results: Group B Strep positive Number of Prenatal Visits:greater than 3 verified prenatal visits Maternal Vaccinations:TDap Other Comments:  None   Results for orders placed or performed during the hospital encounter of 12/07/23 (from the past 24 hours)  Fern Test   Collection Time: 12/07/23  1:56 AM  Result Value Ref Range   POCT Fern Test Positive = ruptured amniotic membanes   CBC   Collection Time: 12/07/23  2:10 AM  Result Value Ref Range   WBC 9.6 4.0 - 10.5 K/uL   RBC 4.13 3.87 - 5.11 MIL/uL   Hemoglobin  11.9 (L) 12.0 - 15.0 g/dL   HCT 64.4 (L) 63.9 - 53.9 %   MCV 86.0 80.0 - 100.0 fL   MCH 28.8 26.0 - 34.0 pg   MCHC 33.5 30.0 - 36.0 g/dL   RDW 85.5 88.4 - 84.4 %   Platelets 278 150 - 400 K/uL   nRBC 0.0 0.0 - 0.2 %  Type and screen [facility]   Collection Time: 12/07/23  2:10 AM  Result Value Ref Range   ABO/RH(D) A POS    Antibody Screen NEG    Sample Expiration  12/10/2023,2359 Performed at [facility], 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 [id number] at [redacted]w[redacted]d here for SOL/SROM confirmed by fern test.   #Labor: Will start Pitocin  2x2 and titrate for regular ctx #Pain: Desires epidural #FWB: Category 1 tracing, reassuring #GBS status:  positive #Feeding: Breastmilk  and Formula #Reproductive Life planning: undecided, possible IUD #Circ:  no  #A1GDM: CBG q4h while in latent labor, will transition to q2h once active  fetus AGA  #AMA  Buel Avers, MD  12/07/2023, 3:25 AM  Attestation of Supervision of Resident:  I confirm that I have verified the information documented in the resident's note and that I have also personally reperformed the history, physical exam and all medical decision making activities.  I have verified that all services and findings are accurately documented in this resident's note; and I agree with management and plan as outlined in the documentation. I have also made any necessary editorial changes.   Alain Sor, MD OB Fellow, Faculty Practice [facility], Center for [facility]

## 2023-12-07 NOTE — Discharge Summary (Signed)
 Postpartum Discharge Summary  Date of Service updated     Patient Name: Danielle Weiss DOB: 1986/02/07 MRN: [id number]  Date of admission: 12/07/2023 Delivery date:12/07/2023 Delivering provider: CLAUDENE, [place]  Date of discharge: 12/08/2023  Admitting diagnosis: Normal labor [O80, Z37.9] Intrauterine pregnancy: [redacted]w[redacted]d     Secondary diagnosis:  Principal Problem:   NSVD (normal spontaneous vaginal delivery) Active Problems:   Supervision of high risk pregnancy, antepartum   GDM (gestational diabetes mellitus)   GBS (group B streptococcus) UTI complicating pregnancy   Alpha thalassemia silent carrier   AMA (advanced maternal age) multigravida 40+   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 yo [phone] at [redacted]w[redacted]d was admitted in Latent Labor on 12/07/2023 for SROM. Labor course was complicated by none.  Membrane Rupture Time/Date: 12:00 AM,12/07/2023  Delivery Method:Vaginal, Spontaneous Operative Delivery:N/A Episiotomy: None Lacerations:  2nd degree Patient had a postpartum course complicated by none.  She is ambulating, tolerating a regular diet, passing flatus, and urinating well. Patient is discharged home in stable condition on 12/08/23.  Newborn Data: Birth date:12/07/2023 Birth time:10:43 AM Gender:Female Living status:Living Apgars:9 ,10  Weight:2722 g  Magnesium Sulfate received: No BMZ received: No Rhophylac:No MMR:No T-DaP:Given prenatally Flu: No RSV Vaccine received: No Transfusion:No  Immunizations received: Immunization History  Administered Date(s) Administered   Influenza Whole 01/10/2010   Tdap 09/22/2023    Physical exam  Vitals:   12/07/23 1741 12/07/23 2123 12/08/23 0129  12/08/23 0525  BP: 122/87 105/77 108/72 100/78  Pulse: 65 68 68 77  Resp: 16 17 18 18   Temp: 98.3 F (36.8 C)     TempSrc: Oral     SpO2: 99% 99% 99% 98%  Weight:      Height:       General: alert, cooperative, and no distress Lochia: appropriate Uterine Fundus: firm Incision: N/A DVT Evaluation: No evidence of DVT seen on physical exam. Labs: Lab Results  Component Value Date   WBC 9.6 12/07/2023   HGB 11.9 (L) 12/07/2023   HCT 35.5 (L) 12/07/2023   MCV 86.0 12/07/2023   PLT 278 12/07/2023      Latest Ref Rng & Units 07/03/2023   10:22 AM  CMP  Glucose 70 - 99 mg/dL 78   BUN 6 - 24 mg/dL 7   Creatinine 9.42 - 8.99 mg/dL 9.38   Sodium 865 - 855 mmol/L 137   Potassium 3.5 - 5.2 mmol/L 4.5   Chloride 96 - 106 mmol/L 104   CO2 20 - 29 mmol/L 19   Calcium 8.7 - 10.2 mg/dL 9.3   Total Protein 6.0 - 8.5 g/dL 6.8   Total Bilirubin 0.0 - 1.2 mg/dL 0.2   Alkaline Phos 44 - 121 IU/L 43   AST 0 - 40 IU/L 24   ALT 0 - 32 IU/L 27    Edinburgh Score:    12/07/2023    9:30 PM  Edinburgh Postnatal Depression Scale Screening Tool  I have been able to laugh and see the funny side of things.  0  I have looked forward with enjoyment to things. 2  I have blamed myself unnecessarily when things went wrong. 2  I have been anxious or worried for no good reason. 0  I have felt scared or panicky for no good reason. 0  Things have been getting on top of me. 0  I have been so unhappy that I have had difficulty sleeping. 0  I have felt sad or miserable. 0  I have been so unhappy that I have been crying. 0  The thought of harming myself has occurred to me. 0  Edinburgh Postnatal Depression Scale Total 4   Edinburgh Postnatal Depression Scale Total: 4   After visit meds:  Allergies as of 12/08/2023   No Known Allergies      Medication List     STOP taking these medications    Accu-Chek Guide Test test strip Generic drug: glucose blood   Accu-Chek Softclix Lancets lancets        TAKE these medications    acetaminophen  500 MG tablet Commonly known as: TYLENOL  Take 2 tablets (1,000 mg total) by mouth every 8 (eight) hours as needed (for pain scale < 4).   EPINEPHrine  0.3 mg/0.3 mL Soaj injection Commonly known as: EpiPen  2-Pak Inject 0.3 mLs (0.3 mg total) into the muscle once.   ibuprofen  800 MG tablet Commonly known as: ADVIL  Take 1 tablet (800 mg total) by mouth every 8 (eight) hours as needed for mild pain (pain score 1-3) or moderate pain (pain score 4-6).   multivitamin-prenatal 27-0.8 MG Tabs tablet Take 1 tablet by mouth daily at 12 noon.   senna 8.6 MG Tabs tablet Commonly known as: SENOKOT Take 2 tablets (17.2 mg total) by mouth at bedtime as needed for mild constipation.         Discharge home in stable condition Infant Feeding: Breast Infant Disposition:home with mother Discharge instruction: per After Visit Summary and Postpartum booklet. Activity: Advance as tolerated. Pelvic rest for 6 weeks.  Diet: routine diet Future Appointments: Future Appointments  Date Time Provider Department Center  12/09/2023  8:15 AM Fredirick Glenys RAMAN, MD [facility]  12/09/2023  8:55 AM [facility] US[facility]  12/16/2023  8:15 AM Fredirick Glenys RAMAN, MD [facility]  12/16/2023  8:55 AM [facility] US2 [facility]  12/23/2023  8:35 AM Ilean Norleen GAILS, MD [facility]  12/30/2023  3:55 PM Zina Jerilynn LABOR, MD [facility]   Follow up Visit:   Please schedule this patient for a In person postpartum visit in 6 weeks with the following provider: Any provider. Additional Postpartum F/U: 2 hour GTT  High risk pregnancy complicated by: AMA, GDM Delivery mode:  Vaginal, Spontaneous Anticipated Birth Control:  Nexplanon  IP   12/08/2023 Almarie CHRISTELLA Moats, MD

## 2023-12-07 NOTE — MAU Note (Signed)
 MAU Labor Triage Note:  .Danielle Weiss is a 38 y.o. at [redacted]w[redacted]d here in MAU reporting: possible SROM.   ROM: possible SROM 20 minutes ago, clear/bloody watery LOF Vaginal Bleeding: blood with mucus  Labor Pain Management Plan: Planning epidural  GBS: Positive  Fetal Movement: Reports positive FM FHT:  deferred due to maternal clothing  Vitals:   12/07/23 0122  BP: 118/82  Pulse: 81  Resp: 16  Temp: 98 F (36.7 C)  SpO2: 99%      Lab orders placed from triage: Fern swab OB Office: Faculty

## 2023-12-07 NOTE — Lactation Note (Signed)
 This note was copied from a baby's chart. Lactation Consultation Note  Patient Name: Danielle Weiss Unijb'd Date: 12/07/2023 Age:40 hours Reason for consult: Mother's request;Early term 63-38.6wks Mom didn't remember see Lactation. LC reminded who Lactation was ans she was the one who got them their DEBP and talked to the about BF their baby. This LC assisted mom in latching in cradle position STS after a bath. Discussed body alignment to prevent baby pulling on nipple. Baby is still spitting up bubbly mucous that is why the hasn't been very interested in BF. When LC first got baby latched he held it in his mouth for a while before finally started suckling. Informed parents that normal for babies to be this way the first 24 hrs.  Mom happy baby has latched and is suckling.  Maternal Data Has patient been taught Hand Expression?: Yes  Feeding    LATCH Score Latch: Grasps breast easily, tongue down, lips flanged, rhythmical sucking.  Audible Swallowing: None  Type of Nipple: Everted at rest and after stimulation  Comfort (Breast/Nipple): Soft / non-tender  Hold (Positioning): Assistance needed to correctly position infant at breast and maintain latch.  LATCH Score: 7   Lactation Tools Discussed/Used    Interventions Interventions: Breast feeding basics reviewed;Assisted with latch;Skin to skin;Breast massage;Hand express;Adjust position;Breast compression;Support pillows;Position options;Education  Discharge Pump: Received Stork Pump  Consult Status Consult Status: Follow-up Date: 12/08/23 Follow-up type: In-patient    Derry Arbogast G 12/07/2023, 11:06 PM

## 2023-12-08 DIAGNOSIS — Z30017 Encounter for initial prescription of implantable subdermal contraceptive: Secondary | ICD-10-CM

## 2023-12-08 MED ORDER — ETONOGESTREL 68 MG ~~LOC~~ IMPL
68.0000 mg | DRUG_IMPLANT | Freq: Once | SUBCUTANEOUS | Status: AC
  Administered 2023-12-08: 68 mg via SUBCUTANEOUS
  Filled 2023-12-08: qty 1

## 2023-12-08 MED ORDER — SENNA 8.6 MG PO TABS: 2.0000 | ORAL_TABLET | Freq: Every evening | ORAL | 0 refills | Status: AC | PRN

## 2023-12-08 MED ORDER — LIDOCAINE HCL 1 % IJ SOLN
0.0000 mL | Freq: Once | INTRAMUSCULAR | Status: AC | PRN
  Administered 2023-12-08: 20 mL via INTRADERMAL
  Filled 2023-12-08: qty 20

## 2023-12-08 MED ORDER — IBUPROFEN 800 MG PO TABS: 800.0000 mg | ORAL_TABLET | Freq: Three times a day (TID) | ORAL | 0 refills | Status: AC | PRN

## 2023-12-08 MED ORDER — ACETAMINOPHEN 500 MG PO TABS: 1000.0000 mg | ORAL_TABLET | Freq: Three times a day (TID) | ORAL | 0 refills | Status: AC | PRN

## 2023-12-08 NOTE — Progress Notes (Signed)
 Informed consent signed by patient for Nexplanon  insertion. Witnessed by this Charity fundraiser.

## 2023-12-08 NOTE — Anesthesia Postprocedure Evaluation (Signed)
 Anesthesia Post Note  Patient: Danielle Weiss  Procedure(s) Performed: AN AD HOC LABOR EPIDURAL     Patient location during evaluation: Mother Baby Anesthesia Type: Epidural Level of consciousness: awake, oriented and awake and alert Pain management: pain level controlled Vital Signs Assessment: post-procedure vital signs reviewed and stable Respiratory status: spontaneous breathing, nonlabored ventilation and respiratory function stable Cardiovascular status: stable Postop Assessment: no headache, patient able to bend at knees, adequate PO intake, no apparent nausea or vomiting and able to ambulate Anesthetic complications: no   No notable events documented.  Last Vitals:  Vitals:   12/08/23 0129 12/08/23 0525  BP: 108/72 100/78  Pulse: 68 77  Resp: 18 18  Temp:    SpO2: 99% 98%    Last Pain:  Vitals:   12/08/23 0759  TempSrc:   PainSc: 0-No pain   Pain Goal:                Epidural/Spinal Function Cutaneous sensation: Normal sensation (12/08/23 0759), Patient able to flex knees: Yes (12/08/23 0759), Patient able to lift hips off bed: Yes (12/08/23 0759), Back pain beyond tenderness at insertion site: No (12/08/23 0759), Progressively worsening motor and/or sensory loss: No (12/08/23 0759), Bowel and/or bladder incontinence post epidural: No (12/08/23 0759)  Gargi Berch

## 2023-12-08 NOTE — Procedures (Signed)
 PRE-OP DIAGNOSIS: desired long-term, reversible contraception   POST-OP DIAGNOSIS: Same   PROCEDURE: Nexplanon  placement  Performing Physician: Shonna Chock, MD   PROCEDURE:  Site (check): left arm        Sterile Preparation:    Betadine        First I obtained written informed consent including discussion of risk of bleeding, infection, and damage to surrounding tissue, as well as discussion of side effects including unscheduled bleeding. Next, a time-out was performed, and then an insertion site was selected 6 - 10 cm from medial epicondyle and marked. Procedure area was prepped and draped in a sterile fashion. 3 mL of 1% lidocaine w/o epinephrine was used for subcutaneous anesthesia. Anesthesia confirmed.  Nexplanon  trocar was inserted subcutaneously and then Nexplanon  capsule delivered subcutaneously. Trocar was removed from the insertion site. Nexplanon  capsule was palpated by provider and patient to assure satisfactory placement.  Estimated blood loss: minimal Dressings applied: steri-strip, band-aid, coband Followup: The patient tolerated the procedure well without complications.  Standard post-procedure care wass explained and return precautions were given.

## 2023-12-08 NOTE — Patient Instructions (Signed)
 If interested in an outpatient lactation consult in office or virtually please reach out to us  at [facility] (First Floor) [address], [place] Please call [phone] and press [phone] for lactation.   -- Lactation support groups:  [facility], Tuesdays 10:00 am -12:00 pm at [address] on the second floor in the conference room, lactating parents and lap babies welcome.  [email or web address]  [email or web address]    Danielle Weiss, [facility]

## 2023-12-09 ENCOUNTER — Encounter: Admitting: Family Medicine

## 2023-12-09 ENCOUNTER — Other Ambulatory Visit

## 2023-12-10 ENCOUNTER — Other Ambulatory Visit: Payer: Self-pay

## 2023-12-10 ENCOUNTER — Inpatient Hospital Stay (HOSPITAL_COMMUNITY)
Admission: AD | Admit: 2023-12-10 | Discharge: 2023-12-10 | Disposition: A | Discharge status: Home / Self Care | Attending: Obstetrics and Gynecology | Admitting: Obstetrics and Gynecology

## 2023-12-10 DIAGNOSIS — R52 Pain, unspecified: Secondary | ICD-10-CM

## 2023-12-10 DIAGNOSIS — T85848A Pain due to other internal prosthetic devices, implants and grafts, initial encounter: Secondary | ICD-10-CM | POA: Insufficient documentation

## 2023-12-10 DIAGNOSIS — Z975 Presence of (intrauterine) contraceptive device: Secondary | ICD-10-CM | POA: Diagnosis present

## 2023-12-10 DIAGNOSIS — M79602 Pain in left arm: Secondary | ICD-10-CM | POA: Diagnosis present

## 2023-12-10 NOTE — MAU Note (Signed)
 Danielle Weiss is a 38 y.o. at Unknown here in MAU reporting: she had birth control (Nexplanon ) inserted into her left arm 2 days ago and her arm and hand are swollen.  States she has pain at insertion site and hand feels numb.  States she can't squeeze her left hand. Denies any other complaints  S/P NVD 12/07/2023   LMP: NA Onset of complaint: yesterday Pain score: 5  Vitals:   12/10/23 1131  BP: 117/81  Pulse: 86  Resp: 19  Temp: 98.2 F (36.8 C)  SpO2: 99%     FHT: NA  Lab orders placed from triage: None

## 2023-12-10 NOTE — Discharge Instructions (Signed)
 Elevated affected arm

## 2023-12-10 NOTE — MAU Provider Note (Signed)
 Chief Complaint: Arm Swelling   None    SUBJECTIVE HPI: Danielle Weiss is a 38 y.o. [id number] at Unknown who presents to Maternity Admissions reporting left hand swelling. Pt states had Nexplanon  placement few days ago and since then her left hand has been swelling and numb w/ pain at Nexplanon  insertion site. Denies fever, chill, or shivering.      Past Medical History:  Diagnosis Date   Angioedema 07/10/2015   GDM (gestational diabetes mellitus) 05/2023   History of food allergy 07/10/2015   Medical history non-contributory    Recurrent urticaria 07/10/2015   SAB (spontaneous abortion) 11/05/2020   OB History  Gravida Para Term Preterm AB Living  4 3 3  1 3   SAB IAB Ectopic Multiple Live Births  1   0 3    # Outcome Date GA Lbr Len/2nd Weight Sex Type Anes PTL Lv  4 Term 12/07/23 [redacted]w[redacted]d  2722 g M Vag-Spont EPI  LIV  3 SAB 10/2020 [redacted]w[redacted]d         2 Term 04/30/10 [redacted]w[redacted]d  3175 g M Vag-Spont   LIV  1 Term 05/03/06 [redacted]w[redacted]d  2722 g F Vag-Spont   LIV   Past Surgical History:  Procedure Laterality Date   NO PAST SURGERIES     Social History   Socioeconomic History   Marital status: Married    Spouse name: Not on file   Number of children: Not on file   Years of education: Not on file   Highest education level: Not on file  Occupational History   Not on file  Tobacco Use   Smoking status: Never   Smokeless tobacco: Never  Vaping Use   Vaping status: Never Used  Substance and Sexual Activity   Alcohol use: No    Alcohol/week: 0.0 standard drinks of alcohol   Drug use: No   Sexual activity: Yes    Birth control/protection: Implant  Other Topics Concern   Not on file  Social History Narrative   Lives with husband and Child. Works at [employer] part time.         Social Drivers of Corporate investment banker Strain: Not on file  Food Insecurity: No Food Insecurity (12/07/2023)   Hunger Vital Sign    Worried About Running Out of Food in the Last Year: Never true    Ran Out  of Food in the Last Year: Never true  Transportation Needs: No Transportation Needs (12/07/2023)   PRAPARE - Administrator, Civil Service (Medical): No    Lack of Transportation (Non-Medical): No  Physical Activity: Not on file  Stress: Not on file  Social Connections: Not on file  Intimate Partner Violence: Not At Risk (12/07/2023)   Humiliation, Afraid, Rape, and Kick questionnaire    Fear of Current or Ex-Partner: No    Emotionally Abused: No    Physically Abused: No    Sexually Abused: No   Family History  Problem Relation Age of Onset   Hypertension Mother    Allergic rhinitis Neg Hx    Angioedema Neg Hx    Asthma Neg Hx    Atopy Neg Hx    Eczema Neg Hx    Immunodeficiency Neg Hx    Urticaria Neg Hx    Cancer Neg Hx    Diabetes Neg Hx    No current facility-administered medications on file prior to encounter.   Current Outpatient Medications on File Prior to Encounter  Medication Sig Dispense Refill  acetaminophen  (TYLENOL ) 500 MG tablet Take 2 tablets (1,000 mg total) by mouth every 8 (eight) hours as needed (for pain scale < 4). 60 tablet 0   EPINEPHrine  (EPIPEN  2-PAK) 0.3 mg/0.3 mL IJ SOAJ injection Inject 0.3 mLs (0.3 mg total) into the muscle once. 1 Device 1   ibuprofen  (ADVIL ) 800 MG tablet Take 1 tablet (800 mg total) by mouth every 8 (eight) hours as needed for mild pain (pain score 1-3) or moderate pain (pain score 4-6). 60 tablet 0   Prenatal Vit-Fe Fumarate-FA (MULTIVITAMIN-PRENATAL) 27-0.8 MG TABS tablet Take 1 tablet by mouth daily at 12 noon.     senna (SENOKOT) 8.6 MG TABS tablet Take 2 tablets (17.2 mg total) by mouth at bedtime as needed for mild constipation. 60 tablet 0   No Known Allergies  I have reviewed patient's Past Medical Hx, Surgical Hx, Family Hx, Social Hx, medications and allergies.   Review of Systems  OBJECTIVE Patient Vitals for the past 24 hrs:  BP Temp Temp src Pulse Resp SpO2 Height Weight  12/10/23 1131 117/81 98.2  F (36.8 C) Oral 86 19 99 % -- --  12/10/23 1126 -- -- -- -- -- -- 4' 10 (1.473 m) 52.9 kg   Constitutional: Well-developed, well-nourished female in no acute distress.  Cardiovascular: normal rate & rhythm, no murmur Respiratory: normal rate and effort. Lung sounds clear throughout GI: Abd soft, non-tender, Pos BS x 4. No guarding or rebound tenderness MS: Extremities nontender, no edema, normal ROM Neurologic: Alert and oriented x 4.  LUE : no redness, pain at Nexplanon  implantation site  LAB RESULTS No results found for this or any previous visit (from the past 24 hours).  IMAGING No results found.  MAU COURSE Orders Placed This Encounter  Procedures   Discharge patient   No orders of the defined types were placed in this encounter.   MDM  ASSESSMENT/PLAN 1. Pain at application site     1. Pain at application site (Primary) No redness or sign of infection at Nexplanon  implantation site F/u with OBGYN if problem continue Recommending elevate LUE Discharge patient home for self care    Allergies as of 12/10/2023   No Known Allergies      Medication List     TAKE these medications    acetaminophen  500 MG tablet Commonly known as: TYLENOL  Take 2 tablets (1,000 mg total) by mouth every 8 (eight) hours as needed (for pain scale < 4).   EPINEPHrine  0.3 mg/0.3 mL Soaj injection Commonly known as: EpiPen  2-Pak Inject 0.3 mLs (0.3 mg total) into the muscle once.   ibuprofen  800 MG tablet Commonly known as: ADVIL  Take 1 tablet (800 mg total) by mouth every 8 (eight) hours as needed for mild pain (pain score 1-3) or moderate pain (pain score 4-6).   multivitamin-prenatal 27-0.8 MG Tabs tablet Take 1 tablet by mouth daily at 12 noon.   senna 8.6 MG Tabs tablet Commonly known as: SENOKOT Take 2 tablets (17.2 mg total) by mouth at bedtime as needed for mild constipation.         Suzen Houston NOVAK, DO PGY 1 Family medicine resident 12/10/2023 1:40 PM

## 2023-12-16 ENCOUNTER — Other Ambulatory Visit

## 2023-12-16 ENCOUNTER — Encounter: Payer: Self-pay | Admitting: Family Medicine

## 2023-12-19 ENCOUNTER — Telehealth (HOSPITAL_COMMUNITY): Payer: Self-pay | Admitting: *Deleted

## 2023-12-19 NOTE — Telephone Encounter (Signed)
 12/19/2023  Name: Danielle Weiss MRN: [id number] DOB: Jul 06, 1985  Reason for Call:  Transition of Care Hospital Discharge Call  Contact Status: Patient Contact Status: Complete  Language assistant needed:          Follow-Up Questions: Do You Have Any Concerns About Your Health As You Heal From Delivery?: No Do You Have Any Concerns About Your Infants Health?: Yes What Concerns Do You Have About Your Baby?: Patient asked about normal stooling patterns for infant. RN instructed patient on normal patterns for infants and signs to report to the pediatrician. No problems reported. Patient voiced no other questions or concerns at this time.  Edinburgh Postnatal Depression Scale:  In the Past 7 Days: I have been able to laugh and see the funny side of things.: As much as I always could I have looked forward with enjoyment to things.: Rather less than I used to I have blamed myself unnecessarily when things went wrong.: Not very often I have been anxious or worried for no good reason.: No, not at all I have felt scared or panicky for no good reason.: No, not at all Things have been getting on top of me.: No, I have been coping as well as ever I have been so unhappy that I have had difficulty sleeping.: Not at all I have felt sad or miserable.: No, not at all I have been so unhappy that I have been crying.: No, never The thought of harming myself has occurred to me.: Never [place] Postnatal Depression Scale Total: 2  PHQ2-9 Depression Scale:     Discharge Follow-up: Edinburgh score requires follow up?: No Patient was advised of the following resources:: Breastfeeding Support Group, Support Group Requested email information - sent by RN Post-discharge interventions: Reviewed Newborn Safe Sleep Practices  Signature Allean IVAR Carton, RN, 12/19/23, [phone]

## 2023-12-23 ENCOUNTER — Encounter: Admitting: Family Medicine

## 2023-12-30 ENCOUNTER — Encounter: Admitting: Obstetrics and Gynecology
